# Patient Record
Sex: Male | Born: 1950
Health system: Southern US, Community
[De-identification: ages and names within clinical notes are randomized; demographics above are authoritative.]

## PROBLEM LIST (undated history)

## (undated) DIAGNOSIS — E785 Hyperlipidemia, unspecified: Secondary | ICD-10-CM

## (undated) DIAGNOSIS — N200 Calculus of kidney: Secondary | ICD-10-CM

## (undated) DIAGNOSIS — G4730 Sleep apnea, unspecified: Secondary | ICD-10-CM

## (undated) DIAGNOSIS — J302 Other seasonal allergic rhinitis: Secondary | ICD-10-CM

## (undated) DIAGNOSIS — Z87442 Personal history of urinary calculi: Secondary | ICD-10-CM

## (undated) DIAGNOSIS — K429 Umbilical hernia without obstruction or gangrene: Secondary | ICD-10-CM

## (undated) HISTORY — PX: HERNIA REPAIR: SHX51

## (undated) HISTORY — DX: Hyperlipidemia, unspecified: E78.5

## (undated) HISTORY — PX: COLONOSCOPY: SHX174

---

## 2006-10-28 ENCOUNTER — Encounter (INDEPENDENT_AMBULATORY_CARE_PROVIDER_SITE_OTHER): Payer: Self-pay | Admitting: *Deleted

## 2006-10-28 ENCOUNTER — Ambulatory Visit: Payer: Self-pay | Admitting: Internal Medicine

## 2006-10-28 ENCOUNTER — Ambulatory Visit (HOSPITAL_COMMUNITY): Admission: RE | Admit: 2006-10-28 | Discharge: 2006-10-28 | Payer: Self-pay | Admitting: Internal Medicine

## 2007-12-08 ENCOUNTER — Ambulatory Visit: Admission: RE | Admit: 2007-12-08 | Discharge: 2007-12-08 | Payer: Self-pay | Admitting: Internal Medicine

## 2007-12-08 ENCOUNTER — Encounter: Payer: Self-pay | Admitting: Pulmonary Disease

## 2008-01-01 ENCOUNTER — Ambulatory Visit: Payer: Self-pay | Admitting: Pulmonary Disease

## 2008-01-30 ENCOUNTER — Ambulatory Visit: Payer: Self-pay | Admitting: Pulmonary Disease

## 2008-02-11 DIAGNOSIS — G4733 Obstructive sleep apnea (adult) (pediatric): Secondary | ICD-10-CM

## 2008-08-25 ENCOUNTER — Emergency Department (HOSPITAL_COMMUNITY): Admission: EM | Admit: 2008-08-25 | Discharge: 2008-08-25 | Payer: Self-pay | Admitting: Emergency Medicine

## 2011-03-24 ENCOUNTER — Emergency Department (HOSPITAL_COMMUNITY): Payer: PRIVATE HEALTH INSURANCE

## 2011-03-24 ENCOUNTER — Emergency Department (HOSPITAL_COMMUNITY)
Admission: EM | Admit: 2011-03-24 | Discharge: 2011-03-24 | Disposition: A | Discharge status: Home / Self Care | Payer: PRIVATE HEALTH INSURANCE | Attending: Emergency Medicine | Admitting: Emergency Medicine

## 2011-03-24 DIAGNOSIS — N201 Calculus of ureter: Secondary | ICD-10-CM | POA: Insufficient documentation

## 2011-03-24 DIAGNOSIS — E7800 Pure hypercholesterolemia, unspecified: Secondary | ICD-10-CM | POA: Insufficient documentation

## 2011-03-24 DIAGNOSIS — E119 Type 2 diabetes mellitus without complications: Secondary | ICD-10-CM | POA: Insufficient documentation

## 2011-03-24 LAB — BASIC METABOLIC PANEL
BUN: 17 mg/dL (ref 6–23)
CO2: 27 mEq/L (ref 19–32)
Calcium: 9 mg/dL (ref 8.4–10.5)
Chloride: 101 mEq/L (ref 96–112)
Creatinine, Ser: 1.24 mg/dL (ref 0.4–1.5)
GFR calc Af Amer: >60 mL/min (ref >60)
Sodium: 138 mEq/L (ref 135–145)

## 2011-03-24 LAB — URINE MICROSCOPIC-ADD ON

## 2011-03-24 LAB — URINALYSIS, ROUTINE W REFLEX MICROSCOPIC
Ketones, ur: NEGATIVE mg/dL
Nitrite: NEGATIVE
Protein, ur: NEGATIVE mg/dL
Specific Gravity, Urine: 1.025 (ref 1.005–1.030)

## 2011-03-24 LAB — HEMOGLOBIN: Hemoglobin: 14.3 g/dL (ref 13.0–17.0)

## 2011-03-26 ENCOUNTER — Ambulatory Visit (HOSPITAL_COMMUNITY)
Admission: RE | Admit: 2011-03-26 | Discharge: 2011-03-26 | Disposition: A | Discharge status: Home / Self Care | Payer: PRIVATE HEALTH INSURANCE | Source: Ambulatory Visit | Attending: Urology | Admitting: Urology

## 2011-03-26 ENCOUNTER — Other Ambulatory Visit (HOSPITAL_COMMUNITY): Payer: Self-pay | Admitting: Urology

## 2011-03-26 ENCOUNTER — Encounter (HOSPITAL_COMMUNITY): Payer: Self-pay

## 2011-03-26 DIAGNOSIS — N201 Calculus of ureter: Secondary | ICD-10-CM

## 2011-03-26 DIAGNOSIS — N200 Calculus of kidney: Secondary | ICD-10-CM | POA: Insufficient documentation

## 2011-03-26 DIAGNOSIS — R109 Unspecified abdominal pain: Secondary | ICD-10-CM | POA: Insufficient documentation

## 2011-03-28 LAB — URINE CULTURE

## 2011-04-03 NOTE — Procedures (Signed)
NAMECHRISTIPHER, Micheal [name]                ACCOUNT NO.:  [id number]   MEDICAL RECORD NO.:  [id number]          PATIENT TYPE:  OUT   LOCATION:  SLEEP LAB                     FACILITY:  [facility]   PHYSICIAN:  Barbaraann Share, MD,FCCPDATE OF BIRTH:  12/12/50   DATE OF STUDY:  12/08/2007                            NOCTURNAL POLYSOMNOGRAM   REFERRING PHYSICIAN:  Kingsley Callander. Ouida Sills, MD   REFERRING PHYSICIAN:  Kingsley Callander. Ouida Sills, MD   INDICATIONS FOR PROCEDURE:  Hypersomnia with sleep apnea.   EPWORTH SCORE:  Five.   SLEEP ARCHITECTURE:  The patient had a total sleep time of 286 minutes  with very little slow wave sleep and REM.  Sleep onset latency was  normal at 16 minutes and REM onset was normal at 78 minutes.  Sleep  efficiency was 80% during the diagnostic portion, and 65% during the  titration portion.   RESPIRATORY DATA:  The patient underwent a split night protocol where he  was found to have 86 obstructive events in the first 137 minutes of  sleep.  This gave him an apnea/hypopnea index during the diagnostic  portion of 38 events per hour.  The events were not positional, but they  were clearly worse during REM.  There was very loud snoring noted  throughout.  By protocol the patient was placed on a CPAP mask and  initiated at 5-6 cm of water pressure.  Unfortunately, he could not  tolerate the CPAP pressure and ultimately asked that the CPAP be  discontinued.  The patient's study was continued as a diagnostic study  and during the last portion he was noted to have an apnea/hypopnea index  of 65 events per hour as he began to develop more prominent REM.   OXYGEN DATA:  There was O2 desaturation as low as 72% with the patient's  obstructive events.   CARDIAC DATA:  No clinically significant cardiac arrhythmias were noted.   MOVEMENT/PARASOMNIA:  None.   IMPRESSION/RECOMMENDATION:  Split night study reveals severe obstructive  sleep apnea with an apnea/hypopnea index during the first half of  the  night of 38 events per hour with O2 desaturation as low as 72%.  Very  loud snoring was noted throughout.  The patient was then placed on CPAP,  however, had very poor tolerance and asked that the CPAP be  discontinued.  The study was continued as a diagnostic study, and his  apnea/hypopnea index during the last half of the night was 65 events per  hour.  Treatment for this degree of sleep apnea should focus  primarily on weight loss as well as CPAP.  The patient will need  desensitization at the time of his CPAP trial at home, and will  ultimately need pressure optimization.      Barbaraann Share, MD,FCCP  Diplomate, American Board of Sleep  Medicine  Electronically Signed     [facility]/MEDQ  D:  01/01/2008 16:18:19  T:  01/03/2008 09:42:57  Job:  [id number]

## 2011-04-06 NOTE — Op Note (Signed)
NAME:  Micheal [name]                ACCOUNT NO.:  [id number]   MEDICAL RECORD NO.:  [id number]          PATIENT TYPE:  AMB   LOCATION:  DAY                           FACILITY:  [facility]   PHYSICIAN:  Lionel December, M.D.    DATE OF BIRTH:  03-30-1951   DATE OF PROCEDURE:  10/28/2006  DATE OF DISCHARGE:                               OPERATIVE REPORT   PROCEDURE:  High-risk ring colonoscopy.   INDICATION:  Micheal [name] is a 60 year old Caucasian male who is undergoing  high-risk screening colonoscopy.  His mother was diagnosed with colon  carcinoma at age 60 and died of metastatic disease at age 95.  His last  exam was 5 years ago and was normal.  The procedure and risks were  reviewed with the patient, informed consent was obtained.   MEDICATIONS FOR CONSCIOUS SEDATION:  Demerol 50 mg IV, Versed 4 mg IV.   FINDINGS:  Procedure performed in endoscopy suite.  The patient's vital  signs and O2 saturation were monitored during procedure and remained  stable.  The patient was placed in the left lateral position and a  rectal examination performed.  No abnormality noted on external or  digital exam.  Olympus video scope was placed in the rectum and advanced  under vision into sigmoid colon and beyond.  Preparation was  satisfactory.  Scope was passed into cecum, which was identified by  appendiceal orifice and ileocecal valve.  As the scope was withdrawn,  colonic mucosa was carefully examined.  There were as a 4 mm polyp on a  fold across from the ileocecal valve. which was ablated via cold biopsy.  There was another 3 x 5 mm polyp at sigmoid colon, which was ablated via  cold biopsy.  Mucosa of the rest of the colon was normal.  Rectal mucosa  similarly was normal.  Scope was retroflexed to examine the anorectal  junction.  There was some thickening to mucosa at the anal canal,  otherwise normal.  Endoscope was straightened and withdrawn.  The  patient tolerated the procedure well.   FINAL  DIAGNOSES:  1. Examination performed to cecum.  2. Two small polyps ablated via cold biopsy, one from cecum, another      one from sigmoid colon.   RECOMMENDATIONS:  Standard instructions given.  I will be contacting the  patient with the results of biopsy.  Given his family history and  today's findings, he will need to return for repeat exam in 5 years from  now.      Lionel December, M.D.  Electronically Signed    NR/MEDQ  D:  10/28/2006  T:  10/28/2006  Job:  [id number]   cc:   Kingsley Callander. Ouida Sills, MD  Fax: [phone]

## 2011-04-11 ENCOUNTER — Encounter (HOSPITAL_COMMUNITY): Payer: PRIVATE HEALTH INSURANCE

## 2011-04-12 ENCOUNTER — Ambulatory Visit: Admit: 2011-04-12 | Payer: PRIVATE HEALTH INSURANCE

## 2011-04-12 ENCOUNTER — Ambulatory Visit (HOSPITAL_COMMUNITY): Payer: PRIVATE HEALTH INSURANCE

## 2011-04-12 ENCOUNTER — Ambulatory Visit (HOSPITAL_COMMUNITY)
Admission: RE | Admit: 2011-04-12 | Discharge: 2011-04-12 | Disposition: A | Discharge status: Home / Self Care | Payer: PRIVATE HEALTH INSURANCE | Source: Ambulatory Visit | Attending: Urology | Admitting: Urology

## 2011-04-12 ENCOUNTER — Emergency Department (HOSPITAL_COMMUNITY)
Admission: EM | Admit: 2011-04-12 | Discharge: 2011-04-12 | Disposition: A | Discharge status: Home / Self Care | Payer: PRIVATE HEALTH INSURANCE | Attending: Emergency Medicine | Admitting: Emergency Medicine

## 2011-04-12 DIAGNOSIS — R109 Unspecified abdominal pain: Secondary | ICD-10-CM | POA: Insufficient documentation

## 2011-04-12 DIAGNOSIS — N201 Calculus of ureter: Secondary | ICD-10-CM

## 2011-04-12 DIAGNOSIS — E7800 Pure hypercholesterolemia, unspecified: Secondary | ICD-10-CM | POA: Insufficient documentation

## 2011-04-12 DIAGNOSIS — N200 Calculus of kidney: Secondary | ICD-10-CM | POA: Insufficient documentation

## 2011-04-12 DIAGNOSIS — E119 Type 2 diabetes mellitus without complications: Secondary | ICD-10-CM | POA: Insufficient documentation

## 2011-04-12 DIAGNOSIS — Z87442 Personal history of urinary calculi: Secondary | ICD-10-CM | POA: Insufficient documentation

## 2011-04-12 HISTORY — PX: CYSTOSCOPY W/ URETERAL STENT PLACEMENT: SHX1429

## 2011-04-12 LAB — DIFFERENTIAL
Basophils Absolute: 0 10*3/uL (ref 0.0–0.1)
Eosinophils Absolute: 0.2 10*3/uL (ref 0.0–0.7)
Lymphs Abs: 2.4 10*3/uL (ref 0.7–4.0)
Monocytes Relative: 9 % (ref 3–12)

## 2011-04-12 LAB — CBC
Hemoglobin: 12.5 g/dL — ABNORMAL LOW (ref 13.0–17.0)
MCH: 29.1 pg (ref 26.0–34.0)
MCHC: 33.4 g/dL (ref 30.0–36.0)
MCV: 87 fL (ref 78.0–100.0)
RBC: 4.3 MIL/uL (ref 4.22–5.81)
RDW: 12.9 % (ref 11.5–15.5)

## 2011-04-12 LAB — BASIC METABOLIC PANEL
BUN: 21 mg/dL (ref 6–23)
Creatinine, Ser: 1.34 mg/dL (ref 0.4–1.5)
GFR calc Af Amer: >60 mL/min (ref >60)
GFR calc non Af Amer: 54 mL/min — ABNORMAL LOW (ref >60)

## 2011-04-12 LAB — URINE MICROSCOPIC-ADD ON

## 2011-04-12 LAB — URINALYSIS, ROUTINE W REFLEX MICROSCOPIC
Ketones, ur: NEGATIVE mg/dL
Leukocytes, UA: NEGATIVE
Nitrite: NEGATIVE
Protein, ur: NEGATIVE mg/dL
Urobilinogen, UA: 0.2 mg/dL (ref 0.0–1.0)

## 2011-04-13 ENCOUNTER — Emergency Department (HOSPITAL_COMMUNITY)
Admission: EM | Admit: 2011-04-13 | Discharge: 2011-04-14 | Discharge status: Against Medical Advice | Payer: PRIVATE HEALTH INSURANCE | Attending: Emergency Medicine | Admitting: Emergency Medicine

## 2011-04-13 DIAGNOSIS — R509 Fever, unspecified: Secondary | ICD-10-CM | POA: Insufficient documentation

## 2011-04-19 NOTE — H&P (Signed)
NAME:  Micheal [name], Micheal [name]                ACCOUNT NO.:  [id number]  MEDICAL RECORD NO.:  [id number]           PATIENT TYPE:  O  LOCATION:  DAY                           FACILITY:  [facility]  PHYSICIAN:  Ky Barban, M.D.DATE OF BIRTH:  26-Sep-1951  DATE OF ADMISSION:  04/11/2011 DATE OF DISCHARGE:  LH                             HISTORY & PHYSICAL   A 60 years old gentleman who has right renal calculus.  He went to the emergency room with the right renal colic Mar 24, 2011.  CT scan showed there is a 7 x 6 mm stone in the right UPJ, no hydronephrosis.  I recommended that he undergo ESL, which we tried, but the patient's stone could not be focused very good.  We tried to break it.  It did not break.  On followup KUB, the stone is still there, which is unbroken, so what I have suggested that either we can retreat it or we do a ureteral stone basket with holmium laser lithotripsy, but I told him I am against retreating it because I would not be able to focus it I know that.  The second choice was doing a stone basket with holmium laser lithotripsy, but there is a real problem whether the stone is in the UPJ it might go back into the kidney then I may not be able to find it.  I may not be able to break it but anyway I told them that in that case I will have to get out without the procedure to being completed then plan for the next procedure, which will be percutaneous nephrolithotripsy.  I told him pro and cons of all this procedure.  He understands, want me to go ahead and schedule stone basket for, which he is coming as outpatient in the morning.  PAST MEDICAL HISTORY:  He has no history of any significant medical problem,  does have non-insulin dependent diabetes for which he is taking oral medicines.  PERSONAL HISTORY:  He does not smoke, drinks 2-3 bees in a week and otherwise unremarkable.  No history of prostate cancer in the family.  REVIEW OF SYSTEMS:  Unremarkable.  His AU score  is only 1.  PHYSICAL EXAMINATION:  VITAL SIGNS:  Blood pressure is 130/80, temperature is normal. CENTRAL NERVOUS SYSTEM:  No gross neurological deficit. HEAD/NECK/EYE AND ENT:  Negative. CHEST:  Symmetrical. HEART:  Regular sinus rhythm. ABDOMEN:  Obese.  Liver, spleen, kidneys not palpable.  No CVA tenderness. EXTERNAL GENITALIA:  Unremarkable. RECTAL:  Deferred. EXTREMITIES:  Normal.  IMPRESSION:  Right renal calculus.  PLAN:  Cystoscopy, right retrograde pyelogram, ureteroscopic stone basket extraction, holmium laser lithotripsy, insertion of double-J stent under anesthesia as outpatient, procedure limitation, complication especially ureteral perforation, stone migration.  Ureteral perforation leading to open surgery was discussed also stone migration was discussed in detail.  He understands, wants me to go ahead and proceed.  Also use of double-J stent was discussed.     Ky Barban, M.D.     MIJ/MEDQ  D:  04/11/2011  T:  04/12/2011  Job:  [id number]  Electronically Signed by  Alleen Borne M.D. on 04/19/2011 03:09:37 PM

## 2011-04-19 NOTE — Op Note (Signed)
NAME:  Micheal [name], Micheal [name]                ACCOUNT NO.:  [id number]  MEDICAL RECORD NO.:  [id number]           PATIENT TYPE:  O  LOCATION:  DAYP                          FACILITY:  [facility]  PHYSICIAN:  Ky Barban, M.D.DATE OF BIRTH:  02-24-1951  DATE OF PROCEDURE:  04/12/2011 DATE OF DISCHARGE:                              OPERATIVE REPORT   PREOPERATIVE DIAGNOSIS:  Right renal calculus.  POSTOPERATIVE DIAGNOSIS:  No renal calculus.  PROCEDURES:  Cystoscopy, right retrograde pyelogram, ureteroscopic stone basket and insertion of double-J stent size 5-French 24-cm.  ANESTHESIA:  General endotracheal.  PROCEDURE:  This gentleman had a 6-7 mm size stone in the right UPJ.  I had given ESL to that stone and about a week ago followup KUB which was done yesterday showed the stone is still intact and so I decided to do a stone basket.  During the night, he had a renal colic, came to the emergency room.  No studies or x-rays were done, but he felt like the stone has moved distally, but I told them I will have to do the x-rays again, so took into the OR under general endotracheal anesthesia in lithotomy position.  After usual prep and drape, #25 cystoscope introduced into the bladder.  Wedge catheter was introduced into the right ureteral orifice.  Hypaque was injected under fluoroscopic control.  The dye goes up into the upper ureter which was normal caliber right near the UPJ ureter was dilated and the upper pole calix was dilated and the renal pelvis did not fill up nicely.  Calyces were not visible.  The renal pelvis did not outline.  I thought that dilated upper pole calix was probably a renal his UPJ area, so a guidewire was passed up into that area and over the guidewire, I tried to pass ureteral access tube, but it will not go beyond ureterovesical junction, so it was just dilated with a balloon dilator #18 and then the ureteral access tube went in without any difficulty.  I  introduced the ureteral access tube and all the way near the UPJ and there was a hydronephrotic drip.  After I removed about 10 mL of urine from the renal pelvis, injected the dye.  It outlined the renal pelvis.  The middle and the upper calix appears to be dilated and the upper ureter was outlined which was slightly dilated down to the level of a filling defect and I thought that was where the stone was which has moved on little bit below the UPJ, so I looked in with the help of the flexible ureteroscope.  I did not see any stone and I looked up all the way in the renal pelvis and upper calix, middle calix and lower calix.  I did not see any stone and there was a lot of redness, edema and these calyces and some chronic inflammatory debris was floating around in that area and but I definitely did not see any stone.  I inspected the entire ureter by pulling the ureteral access tube all the way out and looking with the help of the ureteroscope, I did not  see.  Then, I decided to look into the distal ureter with the help of a rigid scope.  Rigid scope was introduced into the distal ureter.  Guidewire was passed up into the renal pelvis.  Over the guidewire, I advanced the rigid ureteroscope.  I went up into the mid ureter.  I did not see any stone in this area and so I decided to leave a double-J stent over the guidewire after removing the ureteroscope.  I introduced 40-French, 24-cm double-J stent under fluoroscopic control.  I had done another retrograde pyelogram by injecting the dye in through the ureteral access tube and it was in the upper ureter and the renal pelvis was outlined nicely.  I do not see any dilated calix.  They have decompressed and so a double-J stent was positioned with the help of fluoroscopy and nice loop in the renal pelvis and the bladder was obtained, so there was a two possibility, he passed the stone during the night, but it was rather large stone and but anyway  if the stone was still there, I cannot find it with the ureteroscope.  I will have to do a followup CT to see if I can see the stone.  The patient is doing well.  All the instruments were removed, left the operating room in satisfactory condition.     Ky Barban, M.D.     MIJ/MEDQ  D:  04/12/2011  T:  04/13/2011  Job:  [id number]  Electronically Signed by Alleen Borne M.D. on 04/19/2011 03:09:41 PM

## 2011-04-20 ENCOUNTER — Ambulatory Visit (HOSPITAL_COMMUNITY)
Admission: RE | Admit: 2011-04-20 | Discharge: 2011-04-20 | Disposition: A | Discharge status: Home / Self Care | Payer: PRIVATE HEALTH INSURANCE | Source: Ambulatory Visit | Attending: Urology | Admitting: Urology

## 2011-04-20 ENCOUNTER — Other Ambulatory Visit (HOSPITAL_COMMUNITY): Payer: Self-pay | Admitting: Urology

## 2011-04-20 DIAGNOSIS — R109 Unspecified abdominal pain: Secondary | ICD-10-CM

## 2011-04-20 DIAGNOSIS — S37019A Minor contusion of unspecified kidney, initial encounter: Secondary | ICD-10-CM

## 2011-04-20 DIAGNOSIS — N289 Disorder of kidney and ureter, unspecified: Secondary | ICD-10-CM | POA: Insufficient documentation

## 2011-04-20 DIAGNOSIS — N200 Calculus of kidney: Secondary | ICD-10-CM | POA: Insufficient documentation

## 2011-04-20 LAB — CREATININE, SERUM: GFR calc non Af Amer: >60 mL/min (ref >60)

## 2011-04-20 LAB — BUN: BUN: 18 mg/dL (ref 6–23)

## 2011-04-20 MED ORDER — IOHEXOL 300 MG/ML  SOLN
100.0000 mL | Freq: Once | INTRAMUSCULAR | Status: AC | PRN
  Administered 2011-04-20: 100 mL via INTRAVENOUS

## 2011-09-05 ENCOUNTER — Encounter (INDEPENDENT_AMBULATORY_CARE_PROVIDER_SITE_OTHER): Payer: Self-pay | Admitting: *Deleted

## 2011-10-01 ENCOUNTER — Telehealth (INDEPENDENT_AMBULATORY_CARE_PROVIDER_SITE_OTHER): Payer: Self-pay | Admitting: Internal Medicine

## 2011-10-01 NOTE — Telephone Encounter (Signed)
Would like to speak with someone about his tcs that is due. Please return the call to [phone].

## 2011-10-02 ENCOUNTER — Ambulatory Visit (INDEPENDENT_AMBULATORY_CARE_PROVIDER_SITE_OTHER): Payer: PRIVATE HEALTH INSURANCE | Admitting: General Surgery

## 2011-10-02 ENCOUNTER — Encounter (INDEPENDENT_AMBULATORY_CARE_PROVIDER_SITE_OTHER): Payer: Self-pay | Admitting: General Surgery

## 2011-10-02 VITALS — BP 136/94 | HR 68 | Temp 98.3°F | Resp 20 | Ht 75.0 in | Wt 206.6 lb

## 2011-10-02 DIAGNOSIS — K420 Umbilical hernia with obstruction, without gangrene: Secondary | ICD-10-CM

## 2011-10-02 NOTE — Patient Instructions (Signed)
[facility] ° °UMBILICAL OR INGUINAL HERNIA REPAIR: POST OP INSTRUCTIONS ° °Always review your discharge instruction sheet given to you by the facility where your surgery was performed. °IF YOU HAVE DISABILITY OR FAMILY LEAVE FORMS, YOU MUST BRING THEM TO THE OFFICE FOR PROCESSING.   °DO NOT GIVE THEM TO YOUR DOCTOR. ° °1. A  prescription for pain medication may be given to you upon discharge.  Take your pain medication as prescribed, if needed.  If narcotic pain medicine is not needed, then you may take acetaminophen (Tylenol), naprosyn (Alleve) or ibuprofen (Advil) as needed. °2. Take your usually prescribed medications unless otherwise directed. °3. If you need a refill on your pain medication, please contact your pharmacy.  They will contact our office to request authorization. Prescriptions will not be filled after 5 pm or on week-ends. °4. You should follow a light diet the first 24 hours after arrival home, such as soup and crackers, etc.  Be sure to include lots of fluids daily.  Resume your normal diet the day after surgery. °5. Most patients will experience some swelling and bruising around the umbilicus or in the groin and scrotum.  Ice packs and reclining will help.  Swelling and bruising can take several days to resolve.  °6. It is common to experience some constipation if taking pain medication after surgery.  Increasing fluid intake and taking a stool softener (such as Colace) will usually help or prevent this problem from occurring.  A mild laxative (Milk of Magnesia or Miralax) should be taken according to package directions if there are no bowel movements after 48 hours. °7. Unless discharge instructions indicate otherwise, you may remove your bandages 48 hours after surgery, and you may shower at that time.  You may have steri-strips (small skin tapes) in place directly over the incision.  These strips should be left on the skin for 7-10 days and will come off on their own.   If your surgeon used skin glue on the incision, you may shower in 24 hours.  The glue will flake off over the next 2-3 weeks.  Any sutures or staples will be removed at the office during your follow-up visit. °8. ACTIVITIES:  You may resume regular (light) daily activities beginning the next day--such as daily self-care, walking, climbing stairs--gradually increasing activities as tolerated.  You may have sexual intercourse when it is comfortable.  Refrain from any heavy lifting or straining until approved by your doctor. °a. You may drive when you are no longer taking prescription pain medication, you can comfortably wear a seatbelt, and you can safely maneuver your car and apply brakes. °b. RETURN TO WORK:  __________________________________________________________ °9. You should see your doctor in the office for a follow-up appointment approximately 2-3 weeks after your surgery.  Make sure that you call for this appointment within a day or two after you arrive home to insure a convenient appointment time. °10. OTHER INSTRUCTIONS:  __________________________________________________________________________________________________________________________________________________________________________________________  °WHEN TO CALL YOUR DOCTOR: °1. Fever over 101.0 °2. Inability to urinate °3. Nausea and/or vomiting °4. Extreme swelling or bruising °5. Continued bleeding from incision. °6. Increased pain, redness, or drainage from the incision ° °The clinic staff is available to answer your questions during regular business hours.  Please don’t hesitate to call and ask to speak to one of the nurses for clinical concerns.  If you have a medical emergency, go to the nearest emergency room or call 911.  A surgeon from [facility]   is always on call at the hospital ° ° °[address], [address], [place], [place]  [address] ? ° [address] [address], [place], [place]   [address] °[phone] ? [phone] ? FAX  [phone] °Web site: [email or web address] ° ° °

## 2011-10-02 NOTE — Progress Notes (Signed)
Chief Complaint  Patient presents with  . Other    new pt- eval umb hernia    HPI Micheal [name] is a 60 y.o. male.  Referred by Dr. Carylon Perches HPI This is a 60 year old male who presents with a bout 2-3 year history of an umbilical bulge. This area does not bother him in any way except it is present. It is not a little bit bigger over this time and is always out. He can manually reduce this. He has no change in his bowel movements or any nausea or vomiting associated with it. He does have a prior history of a colonoscopy 5 years ago which was negative per his report. He comes in today as he would like to have this repaired it is getting larger.  Past Medical History  Diagnosis Date  . Diabetes mellitus   . Hyperlipidemia   . Chronic kidney disease     hx of kidney stones    Past Surgical History  Procedure Date  . Lithotripsy     Family History  Problem Relation Age of Onset  . Cancer Mother     colon  . Stroke Father     Social History History  Substance Use Topics  . Smoking status: Never Smoker   . Smokeless tobacco: Not on file  . Alcohol Use: 0.0 oz/week    1-3 Cans of beer per week    No Known Allergies  Current Outpatient Prescriptions  Medication Sig Dispense Refill  . aspirin 81 MG tablet Take 81 mg by mouth daily.        . metFORMIN (GLUCOPHAGE-XR) 500 MG 24 hr tablet daily.      . simvastatin (ZOCOR) 20 MG tablet Take 20 mg by mouth at bedtime.          Review of Systems Review of Systems  Constitutional: Negative for fever, chills and unexpected weight change.  HENT: Positive for congestion. Negative for hearing loss, sore throat, trouble swallowing and voice change.   Eyes: Negative for visual disturbance.  Respiratory: Negative for cough and wheezing.   Cardiovascular: Negative for chest pain, palpitations and leg swelling.  Gastrointestinal: Negative for nausea, vomiting, abdominal pain, diarrhea, constipation, blood in stool, abdominal  distention, anal bleeding and rectal pain.  Genitourinary: Negative for hematuria and difficulty urinating.  Musculoskeletal: Negative for arthralgias.  Skin: Negative for rash and wound.  Neurological: Negative for seizures, syncope, weakness and headaches.  Hematological: Negative for adenopathy. Does not bruise/bleed easily.  Psychiatric/Behavioral: Negative for confusion.    Blood pressure 136/94, pulse 68, temperature 98.3 F (36.8 C), temperature source Temporal, resp. rate 20, height 6\' 3"  (1.905 m), weight 206 lb 9.6 oz (93.713 kg).  Physical Exam Physical Exam  Constitutional: He appears well-developed and well-nourished.  Eyes: No scleral icterus.  Neck: Neck supple.  Cardiovascular: Normal rate, regular rhythm and normal heart sounds.   Pulmonary/Chest: Effort normal and breath sounds normal. He has no wheezes. He has no rales.  Abdominal: Soft. Normal appearance and bowel sounds are normal. There is no tenderness. A hernia (nonreducible nontender umbilical hernia with thinned out skin overlying) is present.  Lymphadenopathy:    He has no cervical adenopathy.      Assessment    Irreducible umbilical hernia    Plan       We discussed observation versus repair.  We discussed  an open hernia repair with mesh. I described the procedure in detail.  The patient was given Agricultural engineer.  Goals  should be achieved with surgery. We discussed the usage of mesh and the rationale behind that. We went over the pathophysiology of umbilical hernia. We have elected to perform open umbilicalhernia repair with mesh.  We discussed the risks including bleeding, infection, recurrence, postoperative pain and seroma.      Srihari Shellhammer 10/02/2011, 11:06 AM

## 2011-10-03 NOTE — Telephone Encounter (Signed)
LMOM ASKING PATIENT TO CALL SO I CAN GET HIM SCHEDULED

## 2011-10-04 ENCOUNTER — Other Ambulatory Visit (INDEPENDENT_AMBULATORY_CARE_PROVIDER_SITE_OTHER): Payer: Self-pay | Admitting: *Deleted

## 2011-10-04 DIAGNOSIS — Z800 Family history of malignant neoplasm of digestive organs: Secondary | ICD-10-CM

## 2011-10-04 DIAGNOSIS — Z86010 Personal history of colonic polyps: Secondary | ICD-10-CM

## 2011-10-04 NOTE — Telephone Encounter (Signed)
TCS sch'd 11/22/11, patient aware

## 2011-10-08 ENCOUNTER — Encounter (INDEPENDENT_AMBULATORY_CARE_PROVIDER_SITE_OTHER): Payer: Self-pay | Admitting: *Deleted

## 2011-10-08 ENCOUNTER — Telehealth (INDEPENDENT_AMBULATORY_CARE_PROVIDER_SITE_OTHER): Payer: Self-pay | Admitting: *Deleted

## 2011-10-08 MED ORDER — PEG-KCL-NACL-NASULF-NA ASC-C 100 G PO SOLR: 1.0000 | Freq: Once | ORAL | Status: DC

## 2011-10-08 NOTE — Telephone Encounter (Signed)
Patient needs movi prep 

## 2011-10-10 ENCOUNTER — Other Ambulatory Visit (INDEPENDENT_AMBULATORY_CARE_PROVIDER_SITE_OTHER): Payer: Self-pay | Admitting: Internal Medicine

## 2011-10-16 ENCOUNTER — Encounter: Payer: Self-pay | Admitting: Gastroenterology

## 2011-10-16 ENCOUNTER — Encounter (HOSPITAL_COMMUNITY): Payer: Self-pay | Admitting: Pharmacy Technician

## 2011-10-16 ENCOUNTER — Ambulatory Visit (INDEPENDENT_AMBULATORY_CARE_PROVIDER_SITE_OTHER): Payer: PRIVATE HEALTH INSURANCE | Admitting: Gastroenterology

## 2011-10-16 VITALS — BP 147/91 | HR 79 | Temp 97.8°F | Ht 75.0 in | Wt 306.2 lb

## 2011-10-16 DIAGNOSIS — D126 Benign neoplasm of colon, unspecified: Secondary | ICD-10-CM

## 2011-10-16 DIAGNOSIS — Z800 Family history of malignant neoplasm of digestive organs: Secondary | ICD-10-CM | POA: Insufficient documentation

## 2011-10-16 NOTE — Progress Notes (Signed)
Primary Care Physician:  Carylon Perches, MD  Primary Gastroenterologist:  Roetta Sessions, MD  Chief Complaint  Patient presents with  . Colonoscopy    HPI:  Micheal [name] is a 60 y.o. male here to schedule surveillance colonoscopy. His last 2 colonoscopies were done by Dr. Karilyn Cota. The last one was in December 2007, he had multiple tubular adenomas. He was advised him back in 5 years. His mother had colon cancer diagnosed age 81 and subsequently died 5 years later. Patient came by our office yesterday to see about having a colonoscopy scheduled with Dr. Jena Gauss since Dr. Karilyn Cota is booked out until January.   Patient denies any constipation, diarrhea, melena, rectal bleeding, abdominal pain, vomiting, weight loss. Rarely has heartburn. No dysphagia. Appetite is good.  He has a large umbilical hernia and is scheduled to have it repaired on 10/26/2011 and would like to have his colonoscopy done before this.  Current Outpatient Prescriptions  Medication Sig Dispense Refill  . aspirin 81 MG tablet Take 81 mg by mouth daily.        . metFORMIN (GLUCOPHAGE-XR) 500 MG 24 hr tablet Take 1,000 mg by mouth daily with breakfast.       . simvastatin (ZOCOR) 20 MG tablet Take 20 mg by mouth at bedtime.          Allergies as of 10/16/2011  . (No Known Allergies)    Past Medical History  Diagnosis Date  . Diabetes mellitus   . Hyperlipidemia   . Chronic kidney disease     hx of kidney stones    Past Surgical History  Procedure Date  . Lithotripsy 2012  . Colonoscopy 10/28/2006    Dr. Lionel December, 2 small polyps, one from the cecum, one from sigmoid both tubular adenomas.    Family History  Problem Relation Age of Onset  . Cancer Mother     colon, age 53 deceased  . Stroke Father     History   Social History  . Marital Status: Divorced    Spouse Name: N/A    Number of Children: 2  . Years of Education: N/A   Occupational History  . farmer    Social History Main Topics  . Smoking  status: Never Smoker   . Smokeless tobacco: Not on file  . Alcohol Use: 0.0 oz/week    1-3 Cans of beer per week  . Drug Use: No  . Sexually Active:    Other Topics Concern  . Not on file   Social History Narrative  . No narrative on file      ROS:  General: Negative for anorexia, weight loss, fever, chills, fatigue, weakness. Eyes: Negative for vision changes.  ENT: Negative for hoarseness, difficulty swallowing , nasal congestion. CV: Negative for chest pain, angina, palpitations, dyspnea on exertion, peripheral edema.  Respiratory: Negative for dyspnea at rest, dyspnea on exertion, cough, sputum, wheezing.  GI: See history of present illness. GU:  Negative for dysuria, hematuria, urinary incontinence, urinary frequency, nocturnal urination.  MS: Negative for joint pain, low back pain.  Derm: Negative for rash or itching.  Neuro: Negative for weakness, abnormal sensation, seizure, frequent headaches, memory loss, confusion.  Psych: Negative for anxiety, depression, suicidal ideation, hallucinations.  Endo: Negative for unusual weight change.  Heme: Negative for bruising or bleeding. Allergy: Negative for rash or hives.    Physical Examination:  BP 147/91  Pulse 79  Temp(Src) 97.8 F (36.6 C) (Temporal)  Ht 6\' 3"  (1.905 m)  Wt  306 lb 3.2 oz (138.891 kg)  BMI 38.27 kg/m2   General: Well-nourished, well-developed in no acute distress.  Head: Normocephalic, atraumatic.   Eyes: Conjunctiva pink, no icterus. Mouth: Oropharyngeal mucosa moist and pink , no lesions erythema or exudate. Neck: Supple without thyromegaly, masses, or lymphadenopathy.  Lungs: Clear to auscultation bilaterally.  Heart: Regular rate and rhythm, no murmurs rubs or gallops.  Abdomen: Bowel sounds are normal, nontender, nondistended, no hepatosplenomegaly or masses, no abdominal bruits. Large umbilical hernia with overlying thin skin. no rebound or guarding.   Rectal: Deferred at time of  colonoscopy. Extremities: No lower extremity edema. No clubbing or deformities.  Neuro: Alert and oriented x 4 , grossly normal neurologically.  Skin: Warm and dry, no rash or jaundice.   Psych: Alert and cooperative, normal mood and affect.

## 2011-10-16 NOTE — Assessment & Plan Note (Signed)
Due for surveillance colonoscopy at this time. He is to have the procedure done prior to his umbilical hernia repair, therefore our office has agreed to assist him with getting this done. Otherwise he states he was scheduled out in January with Dr. Karilyn Cota. Patient also desires getting procedure done this calendar year since he has met his deductible.  I have discussed the risks, alternatives, benefits with regards to but not limited to the risk of reaction to medication, bleeding, infection, perforation and the patient is agreeable to proceed. Written consent to be obtained.

## 2011-10-16 NOTE — Patient Instructions (Signed)
We have scheduled you for a colonoscopy. Please see separate instructions. 

## 2011-10-16 NOTE — Progress Notes (Signed)
Cc to PCP 

## 2011-10-18 NOTE — Progress Notes (Signed)
1o gi DOC RMR-RMR OUT OF TOWN. PT DESIRES TO HAVE TCS PRIOR TO UMBILICAL HERNIA SURGERY DEC 7. CT JUN 2012 REVIEWED UMBILICAL HERNIA CONTAINS FAT.

## 2011-10-22 ENCOUNTER — Encounter (HOSPITAL_BASED_OUTPATIENT_CLINIC_OR_DEPARTMENT_OTHER): Payer: Self-pay | Admitting: *Deleted

## 2011-10-22 MED ORDER — SODIUM CHLORIDE 0.45 % IV SOLN
Freq: Once | INTRAVENOUS | Status: AC
  Administered 2011-10-23: 1000 mL via INTRAVENOUS

## 2011-10-22 NOTE — Pre-Procedure Instructions (Signed)
Had EKG 04/11/2011  No CPAP use, uses mouth guard at night; denies waking up gasping/choking, denies daytime sleepiness

## 2011-10-23 ENCOUNTER — Encounter (HOSPITAL_COMMUNITY): Payer: Self-pay | Admitting: *Deleted

## 2011-10-23 ENCOUNTER — Encounter (HOSPITAL_COMMUNITY)
Admission: RE | Disposition: A | Discharge status: Home / Self Care | Payer: Self-pay | Source: Ambulatory Visit | Attending: Gastroenterology

## 2011-10-23 ENCOUNTER — Ambulatory Visit (HOSPITAL_COMMUNITY)
Admission: RE | Admit: 2011-10-23 | Discharge: 2011-10-23 | Disposition: A | Discharge status: Home / Self Care | Payer: PRIVATE HEALTH INSURANCE | Source: Ambulatory Visit | Attending: Gastroenterology | Admitting: Gastroenterology

## 2011-10-23 ENCOUNTER — Other Ambulatory Visit: Payer: Self-pay | Admitting: Gastroenterology

## 2011-10-23 DIAGNOSIS — Z860100 Personal history of colon polyps, unspecified: Secondary | ICD-10-CM

## 2011-10-23 DIAGNOSIS — D126 Benign neoplasm of colon, unspecified: Secondary | ICD-10-CM

## 2011-10-23 DIAGNOSIS — Z800 Family history of malignant neoplasm of digestive organs: Secondary | ICD-10-CM | POA: Insufficient documentation

## 2011-10-23 DIAGNOSIS — Z7982 Long term (current) use of aspirin: Secondary | ICD-10-CM | POA: Insufficient documentation

## 2011-10-23 DIAGNOSIS — K648 Other hemorrhoids: Secondary | ICD-10-CM | POA: Insufficient documentation

## 2011-10-23 DIAGNOSIS — Z1211 Encounter for screening for malignant neoplasm of colon: Secondary | ICD-10-CM

## 2011-10-23 DIAGNOSIS — Z09 Encounter for follow-up examination after completed treatment for conditions other than malignant neoplasm: Secondary | ICD-10-CM | POA: Insufficient documentation

## 2011-10-23 DIAGNOSIS — E785 Hyperlipidemia, unspecified: Secondary | ICD-10-CM | POA: Insufficient documentation

## 2011-10-23 DIAGNOSIS — E119 Type 2 diabetes mellitus without complications: Secondary | ICD-10-CM | POA: Insufficient documentation

## 2011-10-23 HISTORY — PX: COLONOSCOPY: SHX5424

## 2011-10-23 SURGERY — COLONOSCOPY
Anesthesia: Moderate Sedation

## 2011-10-23 MED ORDER — MIDAZOLAM HCL 5 MG/5ML IJ SOLN
INTRAMUSCULAR | Status: DC | PRN
  Administered 2011-10-23: 2 mg via INTRAVENOUS
  Administered 2011-10-23: 1 mg via INTRAVENOUS

## 2011-10-23 MED ORDER — MEPERIDINE HCL 100 MG/ML IJ SOLN
INTRAMUSCULAR | Status: DC | PRN
  Administered 2011-10-23 (×2): 25 mg via INTRAVENOUS

## 2011-10-23 MED ORDER — MEPERIDINE HCL 100 MG/ML IJ SOLN
INTRAMUSCULAR | Status: AC
  Filled 2011-10-23: qty 2

## 2011-10-23 MED ORDER — MIDAZOLAM HCL 5 MG/5ML IJ SOLN
INTRAMUSCULAR | Status: AC
  Filled 2011-10-23: qty 10

## 2011-10-23 NOTE — Interval H&P Note (Signed)
History and Physical Interval Note:  10/23/2011 10:05 AM  Micheal [name]  has presented today for surgery, with the diagnosis of hx of colon polyps  The various methods of treatment have been discussed with the patient and family. After consideration of risks, benefits and other options for treatment, the patient has consented to  Procedure(s): COLONOSCOPY as a surgical intervention .  The patients' history has been reviewed, patient examined, no change in status, stable for surgery.  I have reviewed the patients' chart and labs.  Questions were answered to the patient's satisfaction.     [employer]

## 2011-10-23 NOTE — H&P (Signed)
Reason for Visit     Colonoscopy        Vitals - Last Recorded       BP Pulse Temp(Src) Ht Wt BMI    147/91  79  97.8 F (36.6 C) (Temporal)  6\' 3"  (1.905 m)  306 lb 3.2 oz (138.891 kg)  38.27 kg/m2       Vitals History Recorded       Progress Notes     Tana Coast, [place]  10/16/2011  1:07 PM  Signed Primary Care Physician:  Carylon Perches, MD   Primary Gastroenterologist:  Roetta Sessions, MD    Chief Complaint   Patient presents with   .  Colonoscopy      HPI:  Micheal [name] is a 60 y.o. male here to schedule surveillance colonoscopy. His last 2 colonoscopies were done by Dr. Karilyn Cota. The last one was in December 2007, he had multiple tubular adenomas. He was advised him back in 5 years. His mother had colon cancer diagnosed age 12 and subsequently died 5 years later. Patient came by our office yesterday to see about having a colonoscopy scheduled with Dr. Jena Gauss since Dr. Karilyn Cota is booked out until January.    Patient denies any constipation, diarrhea, melena, rectal bleeding, abdominal pain, vomiting, weight loss. Rarely has heartburn. No dysphagia. Appetite is good.   He has a large umbilical hernia and is scheduled to have it repaired on 10/26/2011 and would like to have his colonoscopy done before this.    Current Outpatient Prescriptions   Medication  Sig  Dispense  Refill   .  aspirin 81 MG tablet  Take 81 mg by mouth daily.           .  metFORMIN (GLUCOPHAGE-XR) 500 MG 24 hr tablet  Take 1,000 mg by mouth daily with breakfast.          .  simvastatin (ZOCOR) 20 MG tablet  Take 20 mg by mouth at bedtime.               Allergies as of 10/16/2011   .  (No Known Allergies)       Past Medical History   Diagnosis  Date   .  Diabetes mellitus     .  Hyperlipidemia     .  Chronic kidney disease         hx of kidney stones       Past Surgical History   Procedure  Date   .  Lithotripsy  2012   .  Colonoscopy  10/28/2006       Dr. Lionel December, 2 small polyps,  one from the cecum, one from sigmoid both tubular adenomas.       Family History   Problem  Relation  Age of Onset   .  Cancer  Mother         colon, age 5 deceased   .  Stroke  Father         History       Social History   .  Marital Status:  Divorced       Spouse Name:  N/A       Number of Children:  2   .  Years of Education:  N/A       Occupational History   .  farmer         Social History Main Topics   .  Smoking status:  Never Smoker    .  Smokeless tobacco:  Not on file   .  Alcohol Use:  0.0 oz/week       1-3 Cans of beer per week   .  Drug Use:  No   .  Sexually Active:         Other Topics  Concern   .  Not on file       Social History Narrative   .  No narrative on file        ROS:   General: Negative for anorexia, weight loss, fever, chills, fatigue, weakness. Eyes: Negative for vision changes.   ENT: Negative for hoarseness, difficulty swallowing , nasal congestion. CV: Negative for chest pain, angina, palpitations, dyspnea on exertion, peripheral edema.   Respiratory: Negative for dyspnea at rest, dyspnea on exertion, cough, sputum, wheezing.   GI: See history of present illness. GU:  Negative for dysuria, hematuria, urinary incontinence, urinary frequency, nocturnal urination.   MS: Negative for joint pain, low back pain.   Derm: Negative for rash or itching.   Neuro: Negative for weakness, abnormal sensation, seizure, frequent headaches, memory loss, confusion.   Psych: Negative for anxiety, depression, suicidal ideation, hallucinations.   Endo: Negative for unusual weight change.   Heme: Negative for bruising or bleeding. Allergy: Negative for rash or hives.     Physical Examination:   BP 147/91  Pulse 79  Temp(Src) 97.8 F (36.6 C) (Temporal)  Ht 6\' 3"  (1.905 m)  Wt 306 lb 3.2 oz (138.891 kg)  BMI 38.27 kg/m2    General: Well-nourished, well-developed in no acute distress.   Head: Normocephalic, atraumatic.    Eyes:  Conjunctiva pink, no icterus. Mouth: Oropharyngeal mucosa moist and pink , no lesions erythema or exudate. Neck: Supple without thyromegaly, masses, or lymphadenopathy.   Lungs: Clear to auscultation bilaterally.   Heart: Regular rate and rhythm, no murmurs rubs or gallops.   Abdomen: Bowel sounds are normal, nontender, nondistended, no hepatosplenomegaly or masses, no abdominal bruits. Large umbilical hernia with overlying thin skin. no rebound or guarding.    Rectal: Deferred at time of colonoscopy. Extremities: No lower extremity edema. No clubbing or deformities.   Neuro: Alert and oriented x 4 , grossly normal neurologically.   Skin: Warm and dry, no rash or jaundice.    Psych: Alert and cooperative, normal mood and affect.     Glendora Score  10/16/2011  2:54 PM  Signed Cc to PCP  Jonette Eva, MD  10/18/2011  1:40 PM  Signed 1o gi DOC RMR-RMR OUT OF TOWN. PT DESIRES TO HAVE TCS PRIOR TO UMBILICAL HERNIA SURGERY DEC 7. CT JUN 2012 REVIEWED UMBILICAL HERNIA CONTAINS FAT.     Colon adenomas - [place]ana [place], [place]  10/16/2011  1:06 PM  Signed Due for surveillance colonoscopy at this time. He is to have the procedure done prior to his umbilical hernia repair, therefore our office has agreed to assist him with getting this done. Otherwise he states he was scheduled out in January with Dr. Karilyn Cota. Patient also desires getting procedure done this calendar year since he has met his deductible.  I have discussed the risks, alternatives, benefits with regards to but not limited to the risk of reaction to medication, bleeding, infection, perforation and the patient is agreeable to proceed. Written consent to be obtained.

## 2011-10-24 ENCOUNTER — Encounter (HOSPITAL_BASED_OUTPATIENT_CLINIC_OR_DEPARTMENT_OTHER)
Admission: RE | Admit: 2011-10-24 | Discharge: 2011-10-24 | Disposition: A | Discharge status: Home / Self Care | Payer: PRIVATE HEALTH INSURANCE | Source: Ambulatory Visit | Attending: General Surgery | Admitting: General Surgery

## 2011-10-24 ENCOUNTER — Ambulatory Visit
Admission: RE | Admit: 2011-10-24 | Discharge: 2011-10-24 | Disposition: A | Discharge status: Home / Self Care | Payer: PRIVATE HEALTH INSURANCE | Source: Ambulatory Visit | Attending: General Surgery | Admitting: General Surgery

## 2011-10-24 LAB — BASIC METABOLIC PANEL
CO2: 27 mEq/L (ref 19–32)
Calcium: 9.4 mg/dL (ref 8.4–10.5)
Creatinine, Ser: 0.97 mg/dL (ref 0.50–1.35)
GFR calc Af Amer: >90 mL/min (ref >90)
GFR calc non Af Amer: 88 mL/min — ABNORMAL LOW (ref >90)
Sodium: 137 mEq/L (ref 135–145)

## 2011-10-24 LAB — CBC
Platelets: 214 10*3/uL (ref 150–400)
RBC: 4.96 MIL/uL (ref 4.22–5.81)
RDW: 13 % (ref 11.5–15.5)
WBC: 7.3 10*3/uL (ref 4.0–10.5)

## 2011-10-26 ENCOUNTER — Telehealth: Payer: Self-pay | Admitting: Gastroenterology

## 2011-10-26 ENCOUNTER — Ambulatory Visit (HOSPITAL_BASED_OUTPATIENT_CLINIC_OR_DEPARTMENT_OTHER): Payer: PRIVATE HEALTH INSURANCE | Admitting: Certified Registered Nurse Anesthetist

## 2011-10-26 ENCOUNTER — Encounter (HOSPITAL_BASED_OUTPATIENT_CLINIC_OR_DEPARTMENT_OTHER): Payer: Self-pay | Admitting: *Deleted

## 2011-10-26 ENCOUNTER — Encounter (HOSPITAL_BASED_OUTPATIENT_CLINIC_OR_DEPARTMENT_OTHER): Payer: Self-pay | Admitting: Certified Registered Nurse Anesthetist

## 2011-10-26 ENCOUNTER — Ambulatory Visit (HOSPITAL_BASED_OUTPATIENT_CLINIC_OR_DEPARTMENT_OTHER)
Admission: RE | Admit: 2011-10-26 | Discharge: 2011-10-26 | Disposition: A | Discharge status: Home / Self Care | Payer: PRIVATE HEALTH INSURANCE | Source: Ambulatory Visit | Attending: General Surgery | Admitting: General Surgery

## 2011-10-26 ENCOUNTER — Encounter (HOSPITAL_BASED_OUTPATIENT_CLINIC_OR_DEPARTMENT_OTHER)
Admission: RE | Disposition: A | Discharge status: Home / Self Care | Payer: Self-pay | Source: Ambulatory Visit | Attending: General Surgery

## 2011-10-26 DIAGNOSIS — E119 Type 2 diabetes mellitus without complications: Secondary | ICD-10-CM | POA: Insufficient documentation

## 2011-10-26 DIAGNOSIS — K420 Umbilical hernia with obstruction, without gangrene: Secondary | ICD-10-CM | POA: Insufficient documentation

## 2011-10-26 DIAGNOSIS — K219 Gastro-esophageal reflux disease without esophagitis: Secondary | ICD-10-CM | POA: Insufficient documentation

## 2011-10-26 DIAGNOSIS — G4733 Obstructive sleep apnea (adult) (pediatric): Secondary | ICD-10-CM | POA: Insufficient documentation

## 2011-10-26 DIAGNOSIS — Z01812 Encounter for preprocedural laboratory examination: Secondary | ICD-10-CM | POA: Insufficient documentation

## 2011-10-26 HISTORY — DX: Calculus of kidney: N20.0

## 2011-10-26 HISTORY — PX: UMBILICAL HERNIA REPAIR: SHX196

## 2011-10-26 HISTORY — DX: Other seasonal allergic rhinitis: J30.2

## 2011-10-26 HISTORY — DX: Umbilical hernia without obstruction or gangrene: K42.9

## 2011-10-26 HISTORY — DX: Sleep apnea, unspecified: G47.30

## 2011-10-26 LAB — GLUCOSE, CAPILLARY: Glucose-Capillary: 118 mg/dL — ABNORMAL HIGH (ref 70–99)

## 2011-10-26 LAB — POCT HEMOGLOBIN-HEMACUE: Hemoglobin: 15 g/dL (ref 13.0–17.0)

## 2011-10-26 SURGERY — REPAIR, HERNIA, UMBILICAL, ADULT
Anesthesia: General | Site: Abdomen | Wound class: Clean

## 2011-10-26 MED ORDER — LACTATED RINGERS IV SOLN
INTRAVENOUS | Status: DC
  Administered 2011-10-26 (×2): via INTRAVENOUS

## 2011-10-26 MED ORDER — MIDAZOLAM HCL 5 MG/5ML IJ SOLN
INTRAMUSCULAR | Status: DC | PRN
  Administered 2011-10-26: 1 mg via INTRAVENOUS

## 2011-10-26 MED ORDER — FENTANYL CITRATE 0.05 MG/ML IJ SOLN
INTRAMUSCULAR | Status: DC | PRN
  Administered 2011-10-26: 100 ug via INTRAVENOUS

## 2011-10-26 MED ORDER — GLYCOPYRROLATE 0.2 MG/ML IJ SOLN
INTRAMUSCULAR | Status: DC | PRN
  Administered 2011-10-26: .5 mg via INTRAVENOUS

## 2011-10-26 MED ORDER — LIDOCAINE HCL (CARDIAC) 20 MG/ML IV SOLN
INTRAVENOUS | Status: DC | PRN
  Administered 2011-10-26: 260 mg via INTRAVENOUS
  Administered 2011-10-26: 40 mg via INTRAVENOUS

## 2011-10-26 MED ORDER — ROCURONIUM BROMIDE 100 MG/10ML IV SOLN
INTRAVENOUS | Status: DC | PRN
  Administered 2011-10-26: 20 mg via INTRAVENOUS

## 2011-10-26 MED ORDER — NEOSTIGMINE METHYLSULFATE 1 MG/ML IJ SOLN
INTRAMUSCULAR | Status: DC | PRN
  Administered 2011-10-26: 3.5 mg via INTRAVENOUS

## 2011-10-26 MED ORDER — ONDANSETRON HCL 4 MG/2ML IJ SOLN
INTRAMUSCULAR | Status: DC | PRN
  Administered 2011-10-26: 4 mg via INTRAVENOUS

## 2011-10-26 MED ORDER — HYDROMORPHONE HCL PF 1 MG/ML IJ SOLN
0.2500 mg | INTRAMUSCULAR | Status: DC | PRN
  Administered 2011-10-26 (×2): 0.25 mg via INTRAVENOUS

## 2011-10-26 MED ORDER — OXYCODONE-ACETAMINOPHEN 5-325 MG PO TABS: 1.0000 | ORAL_TABLET | Freq: Four times a day (QID) | ORAL | Status: AC | PRN

## 2011-10-26 MED ORDER — CEFAZOLIN SODIUM-DEXTROSE 2-3 GM-% IV SOLR
2.0000 g | INTRAVENOUS | Status: AC
  Administered 2011-10-26: 2 g via INTRAVENOUS

## 2011-10-26 MED ORDER — BUPIVACAINE HCL (PF) 0.25 % IJ SOLN
INTRAMUSCULAR | Status: DC | PRN
  Administered 2011-10-26: 10 mL

## 2011-10-26 MED ORDER — SUCCINYLCHOLINE CHLORIDE 20 MG/ML IJ SOLN
INTRAMUSCULAR | Status: DC | PRN
  Administered 2011-10-26: 120 mg via INTRAVENOUS

## 2011-10-26 MED ORDER — PROMETHAZINE HCL 25 MG/ML IJ SOLN: 6.2500 mg | INTRAMUSCULAR | Status: DC | PRN

## 2011-10-26 MED ORDER — DROPERIDOL 2.5 MG/ML IJ SOLN
INTRAMUSCULAR | Status: DC | PRN
  Administered 2011-10-26: 0.625 mg via INTRAVENOUS

## 2011-10-26 SURGICAL SUPPLY — 47 items
ADH SKN CLS APL DERMABOND .7 (GAUZE/BANDAGES/DRESSINGS) ×1
APL SKNCLS STERI-STRIP NONHPOA (GAUZE/BANDAGES/DRESSINGS) ×1
BENZOIN TINCTURE PRP APPL 2/3 (GAUZE/BANDAGES/DRESSINGS) ×1 IMPLANT
BLADE SURG 15 STRL LF DISP TIS (BLADE) ×1 IMPLANT
BLADE SURG 15 STRL SS (BLADE) ×2
BLADE SURG ROTATE 9660 (MISCELLANEOUS) ×1 IMPLANT
CHLORAPREP W/TINT 26ML (MISCELLANEOUS) ×2 IMPLANT
CLOTH BEACON ORANGE TIMEOUT ST (SAFETY) ×2 IMPLANT
COVER MAYO STAND STRL (DRAPES) ×2 IMPLANT
COVER TABLE BACK 60X90 (DRAPES) ×2 IMPLANT
DECANTER SPIKE VIAL GLASS SM (MISCELLANEOUS) IMPLANT
DERMABOND ADVANCED (GAUZE/BANDAGES/DRESSINGS) ×1
DERMABOND ADVANCED .7 DNX12 (GAUZE/BANDAGES/DRESSINGS) IMPLANT
DRAPE PED LAPAROTOMY (DRAPES) ×2 IMPLANT
DRSG TEGADERM 4X4.75 (GAUZE/BANDAGES/DRESSINGS) ×2 IMPLANT
ELECT COATED BLADE 2.86 ST (ELECTRODE) ×2 IMPLANT
ELECT REM PT RETURN 9FT ADLT (ELECTROSURGICAL) ×2
ELECTRODE REM PT RTRN 9FT ADLT (ELECTROSURGICAL) ×1 IMPLANT
GLOVE BIO SURGEON STRL SZ7 (GLOVE) ×2 IMPLANT
GLOVE BIOGEL PI IND STRL 7.5 (GLOVE) ×1 IMPLANT
GLOVE BIOGEL PI INDICATOR 7.5 (GLOVE) ×1
GLOVE ECLIPSE 6.5 STRL STRAW (GLOVE) ×1 IMPLANT
GOWN PREVENTION PLUS XLARGE (GOWN DISPOSABLE) ×1 IMPLANT
NDL HYPO 25X1 1.5 SAFETY (NEEDLE) IMPLANT
NEEDLE HYPO 22GX1.5 SAFETY (NEEDLE) ×1 IMPLANT
NEEDLE HYPO 25X1 1.5 SAFETY (NEEDLE) ×2 IMPLANT
NS IRRIG 1000ML POUR BTL (IV SOLUTION) IMPLANT
PACK BASIN DAY SURGERY FS (CUSTOM PROCEDURE TRAY) ×2 IMPLANT
PATCH VENTRAL MEDIUM 6.4 (Mesh Specialty) ×1 IMPLANT
PENCIL BUTTON HOLSTER BLD 10FT (ELECTRODE) ×2 IMPLANT
SLEEVE SCD COMPRESS KNEE MED (MISCELLANEOUS) ×1 IMPLANT
SPONGE GAUZE 4X4 12PLY (GAUZE/BANDAGES/DRESSINGS) ×1 IMPLANT
SPONGE LAP 4X18 X RAY DECT (DISPOSABLE) ×3 IMPLANT
STRIP CLOSURE SKIN 1/2X4 (GAUZE/BANDAGES/DRESSINGS) ×1 IMPLANT
SUT ETHIBOND 0 MO6 C/R (SUTURE) ×2 IMPLANT
SUT MNCRL AB 4-0 PS2 18 (SUTURE) ×2 IMPLANT
SUT VIC AB 0 SH 27 (SUTURE) IMPLANT
SUT VIC AB 2-0 SH 27 (SUTURE) ×2
SUT VIC AB 2-0 SH 27XBRD (SUTURE) IMPLANT
SUT VIC AB 3-0 SH 27 (SUTURE) ×2
SUT VIC AB 3-0 SH 27X BRD (SUTURE) ×1 IMPLANT
SUT VICRYL 0 TIES 12 18 (SUTURE) ×1 IMPLANT
SUT VICRYL AB 3 0 TIES (SUTURE) IMPLANT
SYR CONTROL 10ML LL (SYRINGE) ×2 IMPLANT
TOWEL OR 17X24 6PK STRL BLUE (TOWEL DISPOSABLE) ×2 IMPLANT
TOWEL OR NON WOVEN STRL DISP B (DISPOSABLE) ×2 IMPLANT
WATER STERILE IRR 1000ML POUR (IV SOLUTION) ×1 IMPLANT

## 2011-10-26 NOTE — Anesthesia Postprocedure Evaluation (Signed)
  Anesthesia Post-op Note  Patient: Micheal [name]  Procedure(s) Performed:  HERNIA REPAIR UMBILICAL ADULT - umbilical hernia repair with mesh  Patient Location: PACU  Anesthesia Type: General  Level of Consciousness: awake and alert   Airway and Oxygen Therapy: Patient Spontanous Breathing  Post-op Pain: none  Post-op Assessment: Post-op Vital signs reviewed, Patient's Cardiovascular Status Stable, Respiratory Function Stable, Patent Airway, No signs of Nausea or vomiting and Pain level controlled  Post-op Vital Signs: Reviewed and stable  Complications: No apparent anesthesia complications

## 2011-10-26 NOTE — Telephone Encounter (Signed)
Results Cc to PCP  

## 2011-10-26 NOTE — Telephone Encounter (Signed)
Please call pt. She had BENIGN POLYP removed from her colon. TCS in 5 years. High fiber diet.

## 2011-10-26 NOTE — Telephone Encounter (Signed)
LMOM to call back

## 2011-10-26 NOTE — Anesthesia Preprocedure Evaluation (Addendum)
Anesthesia Evaluation  Patient identified by MRN, date of birth, ID band Patient awake    Reviewed: Allergy & Precautions, H&P , NPO status , Patient's Chart, lab work & pertinent test results  Airway Mallampati: III TM Distance: >3 FB Neck ROM: Full    Dental  (+) Dental Advisory Given and Teeth Intact   Pulmonary sleep apnea (severe OSA-does not tolerate CPAP) ,  clear to auscultation  Pulmonary exam normal       Cardiovascular neg cardio ROS Regular Normal    Neuro/Psych Negative Neurological ROS  Negative Psych ROS   GI/Hepatic Neg liver ROS, GERD-  Medicated and Controlled,  Endo/Other  Diabetes mellitus-, Type 2, Oral Hypoglycemic AgentsMorbid obesity  Renal/GU    History of kidney stones    Musculoskeletal  (+) Arthritis -,   Abdominal (+) obese,   Peds  Hematology   Anesthesia Other Findings   Reproductive/Obstetrics                       Anesthesia Physical Anesthesia Plan  ASA: III  Anesthesia Plan: General   Post-op Pain Management:    Induction: Intravenous  Airway Management Planned: Oral ETT  Additional Equipment:   Intra-op Plan:   Post-operative Plan: Extubation in OR  Informed Consent: I have reviewed the patients History and Physical, chart, labs and discussed the procedure including the risks, benefits and alternatives for the proposed anesthesia with the patient or authorized representative who has indicated his/her understanding and acceptance.   Dental advisory given  Plan Discussed with: CRNA and Surgeon  Anesthesia Plan Comments: (Plan routine monitors, GETA. Video Glide intubation.)       Anesthesia Quick Evaluation

## 2011-10-26 NOTE — Interval H&P Note (Signed)
History and Physical Interval Note:  10/26/2011 8:37 AM  Micheal [name]  has presented today for surgery, with the diagnosis of fix bellybutton bulge with mesh  The various methods of treatment have been discussed with the patient and family. After consideration of risks, benefits and other options for treatment, the patient has consented to  Procedure(s): Umbilical hernia repair with mesh as a surgical intervention .  The patients' history has been reviewed, patient examined, no change in status, stable for surgery.  I have reviewed the patients' chart and labs.  Questions were answered to the patient's satisfaction.     [name]

## 2011-10-26 NOTE — Transfer of Care (Signed)
Immediate Anesthesia Transfer of Care Note  Patient: Micheal [name]  Procedure(s) Performed:  HERNIA REPAIR UMBILICAL ADULT - umbilical hernia repair with mesh  Patient Location: PACU  Anesthesia Type: General  Level of Consciousness: awake, alert , oriented and patient cooperative  Airway & Oxygen Therapy: Patient Spontanous Breathing and Patient connected to face mask oxygen  Post-op Assessment: Report given to PACU RN and Post -op Vital signs reviewed and stable  Post vital signs: Reviewed and stable  Complications: No apparent anesthesia complications

## 2011-10-26 NOTE — H&P (View-Only) (Signed)
Chief Complaint  Patient presents with  . Other    new pt- eval umb hernia    HPI Micheal [name] is a 60 y.o. male.  Referred by Dr. Roy Fagan HPI This is a 60-year-old male who presents with a bout 2-3 year history of an umbilical bulge. This area does not bother him in any way except it is present. It is not a little bit bigger over this time and is always out. He can manually reduce this. He has no change in his bowel movements or any nausea or vomiting associated with it. He does have a prior history of a colonoscopy 5 years ago which was negative per his report. He comes in today as he would like to have this repaired it is getting larger.  Past Medical History  Diagnosis Date  . Diabetes mellitus   . Hyperlipidemia   . Chronic kidney disease     hx of kidney stones    Past Surgical History  Procedure Date  . Lithotripsy     Family History  Problem Relation Age of Onset  . Cancer Mother     colon  . Stroke Father     Social History History  Substance Use Topics  . Smoking status: Never Smoker   . Smokeless tobacco: Not on file  . Alcohol Use: 0.0 oz/week    1-3 Cans of beer per week    No Known Allergies  Current Outpatient Prescriptions  Medication Sig Dispense Refill  . aspirin 81 MG tablet Take 81 mg by mouth daily.        . metFORMIN (GLUCOPHAGE-XR) 500 MG 24 hr tablet daily.      . simvastatin (ZOCOR) 20 MG tablet Take 20 mg by mouth at bedtime.          Review of Systems Review of Systems  Constitutional: Negative for fever, chills and unexpected weight change.  HENT: Positive for congestion. Negative for hearing loss, sore throat, trouble swallowing and voice change.   Eyes: Negative for visual disturbance.  Respiratory: Negative for cough and wheezing.   Cardiovascular: Negative for chest pain, palpitations and leg swelling.  Gastrointestinal: Negative for nausea, vomiting, abdominal pain, diarrhea, constipation, blood in stool, abdominal  distention, anal bleeding and rectal pain.  Genitourinary: Negative for hematuria and difficulty urinating.  Musculoskeletal: Negative for arthralgias.  Skin: Negative for rash and wound.  Neurological: Negative for seizures, syncope, weakness and headaches.  Hematological: Negative for adenopathy. Does not bruise/bleed easily.  Psychiatric/Behavioral: Negative for confusion.    Blood pressure 136/94, pulse 68, temperature 98.3 F (36.8 C), temperature source Temporal, resp. rate 20, height 6' 3" (1.905 m), weight 206 lb 9.6 oz (93.713 kg).  Physical Exam Physical Exam  Constitutional: He appears well-developed and well-nourished.  Eyes: No scleral icterus.  Neck: Neck supple.  Cardiovascular: Normal rate, regular rhythm and normal heart sounds.   Pulmonary/Chest: Effort normal and breath sounds normal. He has no wheezes. He has no rales.  Abdominal: Soft. Normal appearance and bowel sounds are normal. There is no tenderness. A hernia (nonreducible nontender umbilical hernia with thinned out skin overlying) is present.  Lymphadenopathy:    He has no cervical adenopathy.      Assessment    Irreducible umbilical hernia    Plan       We discussed observation versus repair.  We discussed  an open hernia repair with mesh. I described the procedure in detail.  The patient was given educational material.  Goals   should be achieved with surgery. We discussed the usage of mesh and the rationale behind that. We went over the pathophysiology of umbilical hernia. We have elected to perform open umbilicalhernia repair with mesh.  We discussed the risks including bleeding, infection, recurrence, postoperative pain and seroma.      Micheal [name] 10/02/2011, 11:06 AM    

## 2011-10-26 NOTE — Op Note (Signed)
Preoperative diagnosis: Umbilical hernia Postoperative diagnosis: Incarcerated umbilical hernia  Procedure: Umbilical hernia repair with mesh, proceed ventral patch Excision of incarcerated omentum  Surgeon: Dr. Alvy Mo Anesthesia: Gen. Endotracheal anesthesia Estimated blood loss: None Specimens: None Drains: None Complications: None Sponge needle count correct x2 at end of operation  Indications: This is a 60 year old male with a history of morbid obesity who presents with an umbilical hernia that has been long-standing and increasing in size. I was unable to reduce this in the office. He and I discussed an open umbilical hernia repair likely with mesh. We discussed the risks and benefits associated with the procedure prior to beginning.  Procedure: After informed consent was obtained the patient was taken to the operating room. He had 2 g of intravenous cefazolin administered. He has sequential compression devices placed on his legs prior to beginning the operation. He was then placed under general endotracheal anesthesia without complication. His abdomen was then prepped and draped in the standard sterile surgical fashion. A surgical timeout was performed.  I made a periumbilical incision at the inferior aspect of his umbilicus. I carried this down to the subcutaneous fat. I then encircled his umbilical stalk which had a large amount of omentum incarcerated with a Kelly clamp. I then dissected as umbilical stalk to ensure there was no intestine involved. This was a large amount of omentum that was incarcerated. This hernia defect was about 1.5 cm and I was unable to reduce the omentum. I then excised the omentum that was incarcerated. I was unable to reduce the remainder of this. I also excised most of his hernia sac. I cleaned off the edges to make sure there was fascia to sew to. I then placed a 6.4 cm proceed ventral patch. This was laid flat underneath this fascia. This completely  obliterated the defect. I then sewed the ends to the fascia with 0 Ethibond suture I then tacked it to strip some with 0 Ethibond suture as well. I closed both ends with 0 Ethibond figure-of-eight sutures also. Hemostasis was then obtained. I irrigated copiously. I closed the subcutaneous tissue with 2-0 Vicryl. I tacked his umbilicus down with 3-0 Vicryl. I then closed the subcutaneous tissue with 3-0 Vicryl. I closed the skin with 4-0 Monocryl. The skin was very thinned out due to the chronicity of this hernia sac and it did have a small rent in the skin closed with Dermabond. I placed Dermabond assertions over the wound. Sterile dressing was placed. I infiltrated 10 cc of quarter percent Marcaine. I then he was then extubated and transferred to recovery in stable condition.

## 2011-10-26 NOTE — Anesthesia Procedure Notes (Signed)
Procedure Name: Intubation Date/Time: 10/26/2011 8:53 AM Performed by: Jadalee Westcott D Pre-anesthesia Checklist: Patient identified, Emergency Drugs available, Suction available and Patient being monitored Patient Re-evaluated:Patient Re-evaluated prior to inductionOxygen Delivery Method: Circle System Utilized Preoxygenation: Pre-oxygenation with 100% oxygen Intubation Type: IV induction Ventilation: Mask ventilation without difficulty Grade View: Grade I Tube type: Oral Number of attempts: 1 Airway Equipment and Method: video-laryngoscopy Placement Confirmation: ETT inserted through vocal cords under direct vision,  positive ETCO2 and breath sounds checked- equal and bilateral Secured at: 23 cm Tube secured with: Tape Dental Injury: Teeth and Oropharynx as per pre-operative assessment  Difficulty Due To: Difficulty was anticipated and Difficult Airway- due to limited oral opening

## 2011-10-29 NOTE — Telephone Encounter (Signed)
Informed pt of results.

## 2011-10-30 ENCOUNTER — Encounter (HOSPITAL_BASED_OUTPATIENT_CLINIC_OR_DEPARTMENT_OTHER): Payer: Self-pay | Admitting: General Surgery

## 2011-10-30 NOTE — Telephone Encounter (Signed)
Reminder in epic to have tcs in 5 years °

## 2011-11-01 ENCOUNTER — Encounter (HOSPITAL_COMMUNITY): Payer: Self-pay | Admitting: Gastroenterology

## 2011-11-05 ENCOUNTER — Ambulatory Visit (INDEPENDENT_AMBULATORY_CARE_PROVIDER_SITE_OTHER): Payer: PRIVATE HEALTH INSURANCE | Admitting: General Surgery

## 2011-11-05 ENCOUNTER — Encounter (INDEPENDENT_AMBULATORY_CARE_PROVIDER_SITE_OTHER): Payer: Self-pay | Admitting: General Surgery

## 2011-11-05 VITALS — BP 158/94 | HR 70 | Temp 97.2°F | Resp 18 | Ht 75.5 in | Wt 302.6 lb

## 2011-11-05 DIAGNOSIS — Z09 Encounter for follow-up examination after completed treatment for conditions other than malignant neoplasm: Secondary | ICD-10-CM

## 2011-11-05 NOTE — Progress Notes (Signed)
CC: yellow drainage from incision  HPI: 60 yo morbidly obese WM s/p open repair of incarcerated umbilical hernia with mesh 12/7 by Dr Dwain Sarna comes c/o some yellowish drainage from the actual umbilicus.  No f/c/n/v. Good appetite. Also had some skin irritation. Been applying regular bandaid each day.  BP 158/94  Pulse 70  Temp(Src) 97.2 F (36.2 C) (Temporal)  Resp 18  Ht 6' 3.5" (1.918 m)  Wt 302 lb 9.6 oz (137.258 kg)  BMI 37.32 kg/m2  Alert, nad, smiling cta Obese, soft, nt, nd. Transverse infraumbilical incision - intact, no cellulitis. Has some epidermalysis superior to incision. Has subcu hematoma/seroma. No fluctuance. Can't express any drainage.   Reviewed Dr Doreen Salvage op note  A/P:  S/p open repair of incarcerated umbilical hernia with mesh with postop subcutaneous hematoma vs seroma with some mild superficial skin loss.  No signs of active infection Discussed wound care - wash and pat area dry daily. Apple triple antibiotic ointment. Cover with gauze and minimize tape on skin. Avoid heavy lifting. Fu with Dr Dwain Sarna as scheduled.   Mary Sella. Andrey Campanile, MD, FACS General, Bariatric, & Minimally Invasive Surgery [facility], [place]

## 2011-11-05 NOTE — Patient Instructions (Signed)
May shower with dove soap. Pat area dry Apply triple antibiotic ointment like neosporin Cover with dry gauze.  Change daily.

## 2011-11-22 ENCOUNTER — Ambulatory Visit: Admit: 2011-11-22 | Payer: Self-pay | Admitting: Internal Medicine

## 2011-11-22 SURGERY — COLONOSCOPY
Anesthesia: Moderate Sedation

## 2011-11-23 ENCOUNTER — Encounter (INDEPENDENT_AMBULATORY_CARE_PROVIDER_SITE_OTHER): Payer: Self-pay | Admitting: General Surgery

## 2011-11-23 ENCOUNTER — Ambulatory Visit (INDEPENDENT_AMBULATORY_CARE_PROVIDER_SITE_OTHER): Payer: PRIVATE HEALTH INSURANCE | Admitting: General Surgery

## 2011-11-23 DIAGNOSIS — Z09 Encounter for follow-up examination after completed treatment for conditions other than malignant neoplasm: Secondary | ICD-10-CM

## 2011-11-23 NOTE — Progress Notes (Signed)
Subjective:     Patient ID: Micheal [name], male   DOB: 09-11-1951, 61 y.o.   MRN: [id number]  HPI This is a 61 year old male who had a symptomatic umbilical hernia. He is about a month out now from an umbilical hernia repair with mesh. He had a little bit of drainage from his wound and some superficial separation for which he was seen by one of my partners. This is now essentially all resolved and he is doing well without any significant complaints.  Review of Systems     Objective:   Physical Exam Healing incision with small superficial separation, no infection, no hernia    Assessment:     S/p UH repair    Plan:     I told him at one month he can begin increasing activity and at end of January can have no restrictions Return as needed or call with any problems

## 2014-10-11 ENCOUNTER — Other Ambulatory Visit: Payer: Self-pay | Admitting: Sports Medicine

## 2014-10-11 DIAGNOSIS — Z8739 Personal history of other diseases of the musculoskeletal system and connective tissue: Secondary | ICD-10-CM

## 2014-10-11 DIAGNOSIS — M545 Low back pain: Secondary | ICD-10-CM

## 2014-10-12 ENCOUNTER — Encounter (HOSPITAL_COMMUNITY)
Admission: EM | Disposition: A | Discharge status: Home / Self Care | Payer: Self-pay | Source: Home / Self Care | Attending: Emergency Medicine

## 2014-10-12 ENCOUNTER — Emergency Department (HOSPITAL_COMMUNITY)
Admission: EM | Admit: 2014-10-12 | Discharge: 2014-10-12 | Disposition: A | Discharge status: Home / Self Care | Payer: PRIVATE HEALTH INSURANCE | Attending: Emergency Medicine | Admitting: Emergency Medicine

## 2014-10-12 ENCOUNTER — Emergency Department (HOSPITAL_COMMUNITY): Payer: PRIVATE HEALTH INSURANCE

## 2014-10-12 ENCOUNTER — Ambulatory Visit: Admit: 2014-10-12 | Payer: Self-pay | Admitting: Internal Medicine

## 2014-10-12 ENCOUNTER — Encounter (HOSPITAL_COMMUNITY): Payer: Self-pay | Admitting: Emergency Medicine

## 2014-10-12 DIAGNOSIS — Z7982 Long term (current) use of aspirin: Secondary | ICD-10-CM | POA: Insufficient documentation

## 2014-10-12 DIAGNOSIS — E785 Hyperlipidemia, unspecified: Secondary | ICD-10-CM | POA: Diagnosis not present

## 2014-10-12 DIAGNOSIS — K222 Esophageal obstruction: Secondary | ICD-10-CM

## 2014-10-12 DIAGNOSIS — K228 Other specified diseases of esophagus: Secondary | ICD-10-CM | POA: Insufficient documentation

## 2014-10-12 DIAGNOSIS — E119 Type 2 diabetes mellitus without complications: Secondary | ICD-10-CM | POA: Insufficient documentation

## 2014-10-12 DIAGNOSIS — I6523 Occlusion and stenosis of bilateral carotid arteries: Secondary | ICD-10-CM | POA: Insufficient documentation

## 2014-10-12 DIAGNOSIS — K219 Gastro-esophageal reflux disease without esophagitis: Secondary | ICD-10-CM | POA: Insufficient documentation

## 2014-10-12 DIAGNOSIS — T18108A Unspecified foreign body in esophagus causing other injury, initial encounter: Secondary | ICD-10-CM

## 2014-10-12 DIAGNOSIS — T17208A Unspecified foreign body in pharynx causing other injury, initial encounter: Secondary | ICD-10-CM

## 2014-10-12 DIAGNOSIS — R1319 Other dysphagia: Secondary | ICD-10-CM | POA: Diagnosis not present

## 2014-10-12 DIAGNOSIS — G4730 Sleep apnea, unspecified: Secondary | ICD-10-CM | POA: Diagnosis not present

## 2014-10-12 HISTORY — PX: ESOPHAGOGASTRODUODENOSCOPY: SHX5428

## 2014-10-12 SURGERY — EGD (ESOPHAGOGASTRODUODENOSCOPY)
Anesthesia: Moderate Sedation

## 2014-10-12 SURGERY — CANCELLED PROCEDURE

## 2014-10-12 MED ORDER — MIDAZOLAM HCL 5 MG/5ML IJ SOLN
INTRAMUSCULAR | Status: DC | PRN
  Administered 2014-10-12: 2 mg via INTRAVENOUS

## 2014-10-12 MED ORDER — MIDAZOLAM HCL 5 MG/5ML IJ SOLN
INTRAMUSCULAR | Status: AC
  Filled 2014-10-12: qty 15

## 2014-10-12 MED ORDER — GLUCAGON HCL RDNA (DIAGNOSTIC) 1 MG IJ SOLR
1.0000 mg | Freq: Once | INTRAMUSCULAR | Status: AC
  Administered 2014-10-12: 1 mg via INTRAVENOUS
  Filled 2014-10-12: qty 1

## 2014-10-12 MED ORDER — MEPERIDINE HCL 100 MG/ML IJ SOLN
INTRAMUSCULAR | Status: DC | PRN
  Administered 2014-10-12: 25 mg via INTRAVENOUS

## 2014-10-12 MED ORDER — ONDANSETRON HCL 4 MG/2ML IJ SOLN
INTRAMUSCULAR | Status: DC | PRN
  Administered 2014-10-12: 4 mg via INTRAVENOUS

## 2014-10-12 MED ORDER — BUTAMBEN-TETRACAINE-BENZOCAINE 2-2-14 % EX AERO
INHALATION_SPRAY | CUTANEOUS | Status: DC | PRN
  Administered 2014-10-12: 1 via TOPICAL

## 2014-10-12 MED ORDER — MEPERIDINE HCL 100 MG/ML IJ SOLN
INTRAMUSCULAR | Status: AC
  Filled 2014-10-12: qty 2

## 2014-10-12 MED ORDER — ONDANSETRON HCL 4 MG/2ML IJ SOLN
INTRAMUSCULAR | Status: AC
  Filled 2014-10-12: qty 2

## 2014-10-12 NOTE — Procedures (Signed)
Endoprobe computer failure precluded producing the procedure note in that software  Emergency EGD with esophageal biopsy performed.  Indications:  probable food impaction.  Conscious sedation Versed 2 mg IV and Demerol 25 mg IV. Zofran 4 mg IV Cetacaine spray orally; instrument:Pentex video chip system  Findings  Longitudinally furrowed esophagus with excoriations and all little bit of mucus and food debris at the GE junction. No obstructing lesion seen.  passed the EG junction without difficulty; cursory exam cursory examination stomach revealed food sitting in the proximal stomach with quite a bit of fluid complete examination not attempted. Esophageal mucosa was friable diffusely. GERD. Probable noncritical stricture at the GE junction with a possible ring component. No tumor seen. No Barrett's esophagus seen. No attempted esophageal dilation made per plan. Food bolus passed. I did elect to biopsy the mid and distal esophagus to evaluate for EOE. This was done without difficulty or apparent complication  Patient tolerated procedure well   Impression: abnormal esophagus as described above query EOE status post biopsy; food impaction passed spontaneously with intubation of the esophagus.  Recommendations:  Swallowing precautions reviewed. Begin Protonix 40 mg daily. Follow up on pathology. Complete an elective EGD with possible esophageal dilation in 3-4 weeks.

## 2014-10-12 NOTE — ED Provider Notes (Signed)
CSN: [id number]     Arrival date & time 10/12/14  1850 History   First MD Initiated Contact with Patient 10/12/14 1852     Chief Complaint  Patient presents with  . Airway Obstruction     (Consider location/radiation/quality/duration/timing/severity/associated sxs/prior Treatment) HPI  This is a 63 year old male with a history of hyperlipidemia, diabetes, and sleep apnea who presents with possible food impaction. Patient reports that he was eating then injected and a proximally 2 PM this afternoon when he feels like something got stuck. He reports that since that time he has had multiple episodes of vomiting. He is unable to keep anything liquid down. He denies any abdominal pain.  He is able to tolerate his secretions.  This has never happened before.  Past Medical History  Diagnosis Date  . Hyperlipidemia   . Diabetes mellitus     NIDDM  . Umbilical hernia   . Kidney stones   . Seasonal allergies   . Sleep apnea sleep study 12/08/2007    no CPAP use; uses a mouth guard at night   Past Surgical History  Procedure Laterality Date  . Colonoscopy  10/28/2006; 10/23/2011  . Cystoscopy w/ ureteral stent placement  04/12/2011    right retrograde; basketing of stone  . Umbilical hernia repair  10/26/2011    Procedure: HERNIA REPAIR UMBILICAL ADULT;  Surgeon: Rolm Bookbinder, MD;  Location: [place];  Service: General;  Laterality: N/A;  umbilical hernia repair with mesh  . Colonoscopy  10/23/2011    Procedure: COLONOSCOPY;  Surgeon: Dorothyann Peng, MD;  [facility];  Service: Endoscopy;  Laterality: N/A;  12:45  . Hernia repair     Family History  Problem Relation Age of Onset  . Cancer Mother     colon, age 51 deceased  . Stroke Father    History  Substance Use Topics  . Smoking status: Never Smoker   . Smokeless tobacco: Never Used  . Alcohol Use: 0.0 oz/week    1-3 Cans of beer per week     Comment: occasionally    Review of Systems  HENT:  Positive for trouble swallowing. Negative for drooling.   Respiratory: Positive for choking. Negative for chest tightness and shortness of breath.   Cardiovascular: Negative for chest pain.  Gastrointestinal: Positive for nausea and vomiting. Negative for abdominal pain.  All other systems reviewed and are negative.     Allergies  Review of patient's allergies indicates no known allergies.  Home Medications   Prior to Admission medications   Medication Sig Start Date End Date Taking? Authorizing Provider  aspirin EC 81 MG tablet Take 81 mg by mouth daily.    Yes Historical Provider, MD  lisinopril (PRINIVIL,ZESTRIL) 20 MG tablet Take 20 mg by mouth daily. 09/27/14  Yes Historical Provider, MD  magnesium oxide (MAG-OX) 400 MG tablet Take 400 mg by mouth daily.     Yes Historical Provider, MD  metFORMIN (GLUCOPHAGE-XR) 500 MG 24 hr tablet Take 1,000 mg by mouth daily with breakfast.  09/23/11  Yes Historical Provider, MD  simvastatin (ZOCOR) 20 MG tablet Take 20 mg by mouth at bedtime.    Yes Historical Provider, MD  diazepam (VALIUM) 10 MG tablet 2 day supply starting on 10/12/2014 10/12/14   Historical Provider, MD   BP 144/80 mmHg  Pulse 93  Temp(Src) 97.5 F (36.4 C) (Oral)  Resp 18  Ht 6\' 6"  (1.981 m)  Wt 300 lb (136.079 kg)  BMI 34.68 kg/m2  SpO2 98% Physical Exam  Constitutional: He is oriented to person, place, and time. He appears well-developed and well-nourished.  Overweight, ABC's intact, tolerating his secretions  HENT:  Head: Normocephalic and atraumatic.  Eyes: Pupils are equal, round, and reactive to light.  Neck: Neck supple.  Cardiovascular: Normal rate, regular rhythm and normal heart sounds.   No murmur heard. Pulmonary/Chest: Effort normal and breath sounds normal. No respiratory distress. He has no wheezes.  Abdominal: Soft. Bowel sounds are normal. There is no tenderness. There is no rebound.  Musculoskeletal: He exhibits no edema.  Neurological: He is  alert and oriented to person, place, and time.  Skin: Skin is warm and dry.  Psychiatric: He has a normal mood and affect.  Nursing note and vitals reviewed.   ED Course  Procedures (including critical care time) Labs Review Labs Reviewed - No data to display  Imaging Review Dg Neck Soft Tissue  10/12/2014   CLINICAL DATA:  Full body in the throat since today  EXAM: NECK SOFT TISSUES - 1+ VIEW  COMPARISON:  None.  FINDINGS: There is no evidence of retropharyngeal soft tissue swelling or epiglottic enlargement. The cervical airway is unremarkable and no radio-opaque foreign body identified. There is bilateral carotid artery atherosclerosis.  Degenerative disc disease at C3-4 with disc space narrowing.  IMPRESSION: Unremarkable neck soft tissues.  No radiopaque foreign body.   Electronically Signed   By: Kathreen Devoid   On: 10/12/2014 20:13   Dg Chest 2 View  10/12/2014   CLINICAL DATA:  Foreign body in the throat since today.  EXAM: CHEST  2 VIEW  COMPARISON:  10/24/2011  FINDINGS: The heart size and mediastinal contours are within normal limits. Both lungs are clear. The visualized skeletal structures are unremarkable.  IMPRESSION: No active cardiopulmonary disease.   Electronically Signed   By: Kathreen Devoid   On: 10/12/2014 20:13     EKG Interpretation None      MDM   Final diagnoses:  Foreign body in throat    Patient presents with possible food impaction.  ABCs intact. Tolerating his secretions. However, patient reports he is unable to tolerate liquids. Plain films of the neck and chest are negative for radiopaque foreign body.  Patient given IV glucagon without any results. Discussed with Dr. Gala Romney.  He will come in and evaluate the patient in the endoscopy suite.    Merryl Hacker, MD 10/13/14 [id number]

## 2014-10-12 NOTE — H&P (Signed)
@LOGO @   Primary Care Physician:  Asencion Noble, MD Primary Gastroenterologist:  Dr. Gala Romney  Pre-Procedure History & Physical: HPI:  Micheal [name] is a 63 y.o. male here for evaluation of a  potential food impaction. Eating fried chicken about 2:00 this afternoon  -  swallowed a bolus and felt it lodge behind his breast bone. Has not been able to swallow, even his saliva, ever since that time. Came to the emergency department. Saw Dr. Dina Rich. No effect with glucagon. Dr. Dina Rich called me.   Patient denies significant reflux. Does have intermittent soft dysphagia. No prior EGD.  Past Medical History  Diagnosis Date  . Hyperlipidemia   . Diabetes mellitus     NIDDM  . Umbilical hernia   . Kidney stones   . Seasonal allergies   . Sleep apnea sleep study 12/08/2007    no CPAP use; uses a mouth guard at night    Past Surgical History  Procedure Laterality Date  . Colonoscopy  10/28/2006; 10/23/2011  . Cystoscopy w/ ureteral stent placement  04/12/2011    right retrograde; basketing of stone  . Umbilical hernia repair  10/26/2011    Procedure: HERNIA REPAIR UMBILICAL ADULT;  Surgeon: Rolm Bookbinder, MD;  Location: [place];  Service: General;  Laterality: N/A;  umbilical hernia repair with mesh  . Colonoscopy  10/23/2011    Procedure: COLONOSCOPY;  Surgeon: Dorothyann Peng, MD;  [facility];  Service: Endoscopy;  Laterality: N/A;  12:45  . Hernia repair      Prior to Admission medications   Medication Sig Start Date End Date Taking? Authorizing Provider  aspirin EC 81 MG tablet Take 81 mg by mouth daily.     Historical Provider, MD  diazepam (VALIUM) 10 MG tablet 2 day supply starting on 10/12/2014 10/12/14   Historical Provider, MD  lisinopril (PRINIVIL,ZESTRIL) 20 MG tablet Take 20 mg by mouth daily. 09/27/14   Historical Provider, MD  magnesium oxide (MAG-OX) 400 MG tablet Take 400 mg by mouth daily.      Historical Provider, MD  metFORMIN (GLUCOPHAGE-XR)  500 MG 24 hr tablet Take 1,000 mg by mouth daily with breakfast.  09/23/11   Historical Provider, MD  simvastatin (ZOCOR) 20 MG tablet Take 20 mg by mouth at bedtime.     Historical Provider, MD    Allergies as of 10/12/2014  . (No Known Allergies)    Family History  Problem Relation Age of Onset  . Cancer Mother     colon, age 64 deceased  . Stroke Father     History   Social History  . Marital Status: Married    Spouse Name: N/A    Number of Children: 2  . Years of Education: N/A   Occupational History  . farmer    Social History Main Topics  . Smoking status: Never Smoker   . Smokeless tobacco: Never Used  . Alcohol Use: 0.0 oz/week    1-3 Cans of beer per week     Comment: occasionally  . Drug Use: No  . Sexual Activity: Not on file   Other Topics Concern  . Not on file   Social History Narrative    Review of Systems: See HPI, otherwise negative ROS  Physical Exam: BP 138/93 mmHg  Pulse 102  Temp(Src) 97.6 F (36.4 C)  Resp 18  Ht 6\' 6"  (1.981 m)  Wt 300 lb (136.079 kg)  BMI 34.68 kg/m2  SpO2 97% General:   Alert,  Well-developed, well-nourished, pleasant and cooperative in NAD Skin:  Intact without significant lesions or rashes. Eyes:  Sclera clear, no icterus.   Conjunctiva pink. Ears:  Normal auditory acuity. Nose:  No deformity, discharge,  or lesions. Mouth:  No deformity or lesions. Neck:  Supple; no masses or thyromegaly. No significant cervical adenopathy. Lungs:  Clear throughout to auscultation.   No wheezes, crackles, or rhonchi. No acute distress. Heart:  Regular rate and rhythm; no murmurs, clicks, rubs,  or gallops. Abdomen: Non-distended, normal bowel sounds.  Soft and nontender without appreciable mass or hepatosplenomegaly.  Pulses:  Normal pulses noted. Extremities:  Without clubbing or edema.  Impression/Recommendations:  63 year old gentleman presented to the ED this evening with probable food impaction by history. Chronic  intermittent esophageal dysphagia. Offered emergent EGD with removal of food impaction. If upper GI tract not empty, will need to defer esophageal dilation is appropriate into an elective setting.The risks, benefits, limitations, alternatives and imponderables have been reviewed with the patient. Potential for esophageal dilation, biopsy, etc. have also been reviewed.  Questions have been answered. All parties agreeable.         Notice: This dictation was prepared with Dragon dictation along with smaller phrase technology. Any transcriptional errors that result from this process are unintentional and may not be corrected upon review.

## 2014-10-12 NOTE — ED Notes (Signed)
Per Endo, send pt over with family they will explain procedure and have pt sign consent.

## 2014-10-12 NOTE — Discharge Instructions (Addendum)
EGD Discharge instructions Please read the instructions outlined below and refer to this sheet in the next few weeks. These discharge instructions provide you with general information on caring for yourself after you leave the hospital. Your doctor may also give you specific instructions. While your treatment has been planned according to the most current medical practices available, unavoidable complications occasionally occur. If you have any problems or questions after discharge, please call your doctor. ACTIVITY  You may resume your regular activity but move at a slower pace for the next 24 hours.   Take frequent rest periods for the next 24 hours.   Walking will help expel (get rid of) the air and reduce the bloated feeling in your abdomen.   No driving for 24 hours (because of the anesthesia (medicine) used during the test).   You may shower.   Do not sign any important legal documents or operate any machinery for 24 hours (because of the anesthesia used during the test).  NUTRITION  Drink plenty of fluids.   You may resume your normal diet.   Begin with a light meal and progress to your normal diet.   Avoid alcoholic beverages for 24 hours or as instructed by your caregiver.  MEDICATIONS  You may resume your normal medications unless your caregiver tells you otherwise.  WHAT YOU CAN EXPECT TODAY  You may experience abdominal discomfort such as a feeling of fullness or gas pains.  FOLLOW-UP  Your doctor will discuss the results of your test with you.  SEEK IMMEDIATE MEDICAL ATTENTION IF ANY OF THE FOLLOWING OCCUR:  Excessive nausea (feeling sick to your stomach) and/or vomiting.   Severe abdominal pain and distention (swelling).   Trouble swallowing.   Temperature over 101 F (37.8 C).   Rectal bleeding or vomiting of blood.    GERD information provided  Swallowing precautions reviewed  Begin Protonix 40 mg daily  We'll arrange repeat EGD with possible  esophageal dilation in about 3-4 weeks   Gastroesophageal Reflux Disease, Adult Gastroesophageal reflux disease (GERD) happens when acid from your stomach flows up into the esophagus. When acid comes in contact with the esophagus, the acid causes soreness (inflammation) in the esophagus. Over time, GERD may create small holes (ulcers) in the lining of the esophagus. CAUSES   Increased body weight. This puts pressure on the stomach, making acid rise from the stomach into the esophagus.  Smoking. This increases acid production in the stomach.  Drinking alcohol. This causes decreased pressure in the lower esophageal sphincter (valve or ring of muscle between the esophagus and stomach), allowing acid from the stomach into the esophagus.  Late evening meals and a full stomach. This increases pressure and acid production in the stomach.  A malformed lower esophageal sphincter. Sometimes, no cause is found. SYMPTOMS   Burning pain in the lower part of the mid-chest behind the breastbone and in the mid-stomach area. This may occur twice a week or more often.  Trouble swallowing.  Sore throat.  Dry cough.  Asthma-like symptoms including chest tightness, shortness of breath, or wheezing. DIAGNOSIS  Your caregiver may be able to diagnose GERD based on your symptoms. In some cases, X-rays and other tests may be done to check for complications or to check the condition of your stomach and esophagus. TREATMENT  Your caregiver may recommend over-the-counter or prescription medicines to help decrease acid production. Ask your caregiver before starting or adding any new medicines.  HOME CARE INSTRUCTIONS   Change the factors  that you can control. Ask your caregiver for guidance concerning weight loss, quitting smoking, and alcohol consumption.  Avoid foods and drinks that make your symptoms worse, such as:  Caffeine or alcoholic drinks.  Chocolate.  Peppermint or mint flavorings.  Garlic  and onions.  Spicy foods.  Citrus fruits, such as oranges, lemons, or limes.  Tomato-based foods such as sauce, chili, salsa, and pizza.  Fried and fatty foods.  Avoid lying down for the 3 hours prior to your bedtime or prior to taking a nap.  Eat small, frequent meals instead of large meals.  Wear loose-fitting clothing. Do not wear anything tight around your waist that causes pressure on your stomach.  Raise the head of your bed 6 to 8 inches with wood blocks to help you sleep. Extra pillows will not help.  Only take over-the-counter or prescription medicines for pain, discomfort, or fever as directed by your caregiver.  Do not take aspirin, ibuprofen, or other nonsteroidal anti-inflammatory drugs (NSAIDs). SEEK IMMEDIATE MEDICAL CARE IF:   You have pain in your arms, neck, jaw, teeth, or back.  Your pain increases or changes in intensity or duration.  You develop nausea, vomiting, or sweating (diaphoresis).  You develop shortness of breath, or you faint.  Your vomit is green, yellow, black, or looks like coffee grounds or blood.  Your stool is red, bloody, or black. These symptoms could be signs of other problems, such as heart disease, gastric bleeding, or esophageal bleeding. MAKE SURE YOU:   Understand these instructions.  Will watch your condition.  Will get help right away if you are not doing well or get worse. Document Released: 08/15/2005 Document Revised: 01/28/2012 Document Reviewed: 05/25/2011 [facility] Patient Information 2015 [place], [place]. This information is not intended to replace advice given to you by your health care provider. Make sure you discuss any questions you have with your health care provider.

## 2014-10-12 NOTE — ED Notes (Signed)
Pt c/o feeling like there is something stuck in his throat. Pt state he was eating chicken about 1400. Pt states he cannot keep liquids down.

## 2014-10-13 ENCOUNTER — Telehealth: Payer: Self-pay | Admitting: Gastroenterology

## 2014-10-13 NOTE — Telephone Encounter (Signed)
Patient seen after hours for emergency food disimpaction. He needs an OV with [place] in next 2-3 weeks to discuss elective EGD/ED

## 2014-10-13 NOTE — Telephone Encounter (Signed)
APPT MADE IN URGENT SLOT, [place] PER ANNA. PATIENT AWARE OF DATE AND TIME

## 2014-10-16 ENCOUNTER — Encounter: Payer: Self-pay | Admitting: Internal Medicine

## 2014-10-19 ENCOUNTER — Telehealth: Payer: Self-pay

## 2014-10-19 NOTE — Telephone Encounter (Signed)
Send letter to patient.  Send copy of letter with path to referring provider and PCP.  Needs ov w extender in 1 month to set up repeat egd w pos dilation.  Send him info on GERD and EOE

## 2014-10-19 NOTE — Telephone Encounter (Signed)
Faxed to pcp °

## 2014-10-19 NOTE — Telephone Encounter (Signed)
Letter and info sheets mailed to the pt.

## 2014-10-19 NOTE — Telephone Encounter (Signed)
SPOKE TO PATIENT HE IS AWARE OF APPOINTMENT

## 2014-10-21 ENCOUNTER — Encounter (HOSPITAL_COMMUNITY): Payer: Self-pay | Admitting: Internal Medicine

## 2014-10-26 ENCOUNTER — Other Ambulatory Visit: Payer: Self-pay | Admitting: Sports Medicine

## 2014-10-26 DIAGNOSIS — Z139 Encounter for screening, unspecified: Secondary | ICD-10-CM

## 2014-10-27 ENCOUNTER — Ambulatory Visit
Admission: RE | Admit: 2014-10-27 | Discharge: 2014-10-27 | Disposition: A | Discharge status: Home / Self Care | Payer: PRIVATE HEALTH INSURANCE | Source: Ambulatory Visit | Attending: Sports Medicine | Admitting: Sports Medicine

## 2014-10-27 ENCOUNTER — Other Ambulatory Visit: Payer: PRIVATE HEALTH INSURANCE

## 2014-10-27 DIAGNOSIS — M545 Low back pain: Secondary | ICD-10-CM

## 2014-10-27 DIAGNOSIS — Z8739 Personal history of other diseases of the musculoskeletal system and connective tissue: Secondary | ICD-10-CM

## 2014-10-27 DIAGNOSIS — Z139 Encounter for screening, unspecified: Secondary | ICD-10-CM

## 2014-11-04 ENCOUNTER — Other Ambulatory Visit: Payer: Self-pay

## 2014-11-04 ENCOUNTER — Encounter: Payer: Self-pay | Admitting: Gastroenterology

## 2014-11-04 ENCOUNTER — Ambulatory Visit (INDEPENDENT_AMBULATORY_CARE_PROVIDER_SITE_OTHER): Payer: PRIVATE HEALTH INSURANCE | Admitting: Gastroenterology

## 2014-11-04 VITALS — BP 134/86 | HR 83 | Temp 97.9°F | Ht 74.0 in | Wt 312.8 lb

## 2014-11-04 DIAGNOSIS — K200 Eosinophilic esophagitis: Secondary | ICD-10-CM | POA: Insufficient documentation

## 2014-11-04 DIAGNOSIS — K222 Esophageal obstruction: Secondary | ICD-10-CM | POA: Insufficient documentation

## 2014-11-04 NOTE — Patient Instructions (Signed)
Upper endoscopy as scheduled. See separate instructions.  We will attempt to get allergy testing records but you may still require referral for additional testing for food allergies.

## 2014-11-04 NOTE — Progress Notes (Signed)
Primary Care Physician:  FAGAN,ROY, MD  Primary Gastroenterologist:  Michael Rourk, MD   Chief Complaint  Patient presents with  . EGD    HPI:  Micheal [name] is a 63 y.o. male here to schedule a repeat EGD for esophageal dilation. Recently seen emergently on 10/12/2014 with question will food impaction. Patient states he felt the food dislodge a few minutes before the procedure, actually vomited the bolus up. At time of his EGD he had longitudinally furrowed esophagus with excoriations with a little bit of mucus and food debris at the GE junction. Esophageal mucosa was friable diffusely. Felt to have a probable noncritical stricture at the GE junction with possible ring component. Mid and distal esophagus was biopsied to evaluate for EOE, biopsies consistent with eosinophilic esophagitis.  Patient states he previously went to an allergist and is "allergic to everything". He is not sure whether he had food testing however. Going to try to get us the name of the physician so we can request records. Overall he feels well. Really hasn't had any issue swallowing. Denies heartburn, abdominal pain, constipation, diarrhea, melena, rectal bleeding, vomiting.   Patient states that he would like to consider going back to Dr. Rehman since he used to be his primary GI. He ended up seeing us again in 2012 when he was in need of having colonoscopy done prior to hernia surgery and we were able to facilitate this for him. Dr. Fields actually did the procedure since Dr. Rourk was out of town at the time. Dr. Rourk is listed as primary gastroenterologist for our practice. I did mention this to Camille, our office manager, today and she stated that patient should make decision as far as who he will continue his GI care with.    Current Outpatient Prescriptions  Medication Sig Dispense Refill  . aspirin EC 81 MG tablet Take 81 mg by mouth daily.     . lisinopril (PRINIVIL,ZESTRIL) 20 MG tablet Take 20 mg by mouth  daily.    . metFORMIN (GLUCOPHAGE-XR) 500 MG 24 hr tablet Take 1,000 mg by mouth daily with breakfast.     . pantoprazole (PROTONIX) 40 MG tablet Take 40 mg by mouth daily.      No current facility-administered medications for this visit.    Allergies as of 11/04/2014  . (No Known Allergies)    Past Medical History  Diagnosis Date  . Hyperlipidemia   . Diabetes mellitus     NIDDM  . Umbilical hernia   . Kidney stones   . Seasonal allergies   . Sleep apnea sleep study 12/08/2007    no CPAP use; uses a mouth guard at night    Past Surgical History  Procedure Laterality Date  . Colonoscopy  10/28/2006; 10/23/2011  . Cystoscopy w/ ureteral stent placement  04/12/2011    right retrograde; basketing of stone  . Umbilical hernia repair  10/26/2011    Procedure: HERNIA REPAIR UMBILICAL ADULT;  Surgeon: Matthew Wakefield, MD;  Location: [facility];  Service: General;  Laterality: N/A;  umbilical hernia repair with mesh  . Colonoscopy  10/23/2011    SLF:  1). Internal hemorroids, small  2). POLYPOID LESION (AC) . next TCS 10/2016  . Hernia repair    . Esophagogastroduodenoscopy N/A 10/12/2014    RMR:  abnormal esophagus as described above query EOE status post biopsy; food impaction passes spontaneously with intubation of the esophagus c/w EOE, probable noncritical stricture at GEJ with possible ring component. food bolus   had passed.     Family History  Problem Relation Age of Onset  . Cancer Mother     colon, age 70 deceased  . Stroke Father     History   Social History  . Marital Status: Married    Spouse Name: N/A    Number of Children: 2  . Years of Education: N/A   Occupational History  . farmer    Social History Main Topics  . Smoking status: Never Smoker   . Smokeless tobacco: Never Used  . Alcohol Use: 0.0 oz/week    1-3 Cans of beer per week     Comment: occasionally  . Drug Use: No  . Sexual Activity: Not on file   Other Topics Concern  . Not  on file   Social History Narrative      ROS:  General: Negative for anorexia, weight loss, fever, chills, fatigue, weakness. Eyes: Negative for vision changes.  ENT: Negative for hoarseness, difficulty swallowing , nasal congestion. CV: Negative for chest pain, angina, palpitations, dyspnea on exertion, peripheral edema.  Respiratory: Negative for dyspnea at rest, dyspnea on exertion, cough, sputum, wheezing.  GI: See history of present illness. GU:  Negative for dysuria, hematuria, urinary incontinence, urinary frequency, nocturnal urination.  MS: Negative for joint pain, low back pain.  Derm: Negative for rash or itching.  Neuro: Negative for weakness, abnormal sensation, seizure, frequent headaches, memory loss, confusion.  Psych: Negative for anxiety, depression, suicidal ideation, hallucinations.  Endo: Negative for unusual weight change.  Heme: Negative for bruising or bleeding. Allergy: Negative for rash or hives.    Physical Examination:  BP 134/86 mmHg  Pulse 83  Temp(Src) 97.9 F (36.6 C) (Oral)  Ht 6' 2" (1.88 m)  Wt 312 lb 12.8 oz (141.885 kg)  BMI 40.14 kg/m2   General: Well-nourished, well-developed in no acute distress.  Head: Normocephalic, atraumatic.   Eyes: Conjunctiva pink, no icterus. Mouth: Oropharyngeal mucosa moist and pink , no lesions erythema or exudate. Neck: Supple without thyromegaly, masses, or lymphadenopathy.  Lungs: Clear to auscultation bilaterally.  Heart: Regular rate and rhythm, no murmurs rubs or gallops.  Abdomen: Bowel sounds are normal, nontender, nondistended, no hepatosplenomegaly or masses, no abdominal bruits or    hernia , no rebound or guarding.   Rectal: not performed Extremities: No lower extremity edema. No clubbing or deformities.  Neuro: Alert and oriented x 4 , grossly normal neurologically.  Skin: Warm and dry, no rash or jaundice.   Psych: Alert and cooperative, normal mood and affect.    Imaging Studies: Dg Eye  Foreign Body  10/27/2014   CLINICAL DATA:  Evaluate for metallic foreign body.  EXAM: ORBITS FOR FOREIGN BODY - 2 VIEW  COMPARISON:  None.  FINDINGS: There is no evidence of metallic foreign body within the orbits. No significant bone abnormality identified.  IMPRESSION: No evidence of metallic foreign body within the orbits.   Electronically Signed   By: Thomas  Register   On: 10/27/2014 09:01   Dg Neck Soft Tissue  10/12/2014   CLINICAL DATA:  Full body in the throat since today  EXAM: NECK SOFT TISSUES - 1+ VIEW  COMPARISON:  None.  FINDINGS: There is no evidence of retropharyngeal soft tissue swelling or epiglottic enlargement. The cervical airway is unremarkable and no radio-opaque foreign body identified. There is bilateral carotid artery atherosclerosis.  Degenerative disc disease at C3-4 with disc space narrowing.  IMPRESSION: Unremarkable neck soft tissues.  No radiopaque foreign body.     Electronically Signed   By: Hetal  Patel   On: 10/12/2014 20:13   Dg Chest 2 View  10/12/2014   CLINICAL DATA:  Foreign body in the throat since today.  EXAM: CHEST  2 VIEW  COMPARISON:  10/24/2011  FINDINGS: The heart size and mediastinal contours are within normal limits. Both lungs are clear. The visualized skeletal structures are unremarkable.  IMPRESSION: No active cardiopulmonary disease.   Electronically Signed   By: Hetal  Patel   On: 10/12/2014 20:13   Mr Lumbar Spine Wo Contrast  10/27/2014   CLINICAL DATA:  Back and right leg pain since May 2015. No known injury.  EXAM: MRI LUMBAR SPINE WITHOUT CONTRAST  TECHNIQUE: Multiplanar, multisequence MR imaging of the lumbar spine was performed. No intravenous contrast was administered.  COMPARISON:  CT scan 04/20/2011.  FINDINGS: Advanced degenerative lumbar spondylosis with multilevel disc disease and facet disease. Stable degenerative anterolisthesis of L5 due to severe facet disease. No definite pars defects. The last full intervertebral disc space is  labeled L5-S1 and the conus medullaris terminates at L1.  No significant paraspinal or retroperitoneal findings.  L1-2: Advanced degenerative disc disease with a bulging degenerated annulus and osteophytic ridging. There is also moderate facet disease. No significant spinal stenosis. Mild bilateral lateral recess encroachment and mild right foraminal encroachment.  L2-3: Degenerative disc disease and facet disease but no focal disc protrusion, spinal or foraminal stenosis.  L3-4: Small central disc protrusion with mild mass effect on the ventral thecal sac. There is also a bulging annulus and osteophytic ridging but no significant spinal or foraminal stenosis.  L4-5: Bulging uncovered disc with slight flattening of the ventral thecal sac but no significant spinal or lateral recess stenosis. There is a shallow broad-based extra foraminal and far lateral disc protrusion on the left potentially irritating the exiting left L4 nerve root.  L5-S1: Degenerative anterolisthesis of L5 with a bulging uncovered disc and osteophytic spurring. No significant spinal stenosis. There is also severe facet disease. There is bilateral foraminal stenosis, left greater than right.  IMPRESSION: 1. Advanced degenerative lumbar spondylosis with multilevel disc disease and facet disease. 2. Mild bilateral lateral recess encroachment and mild right foraminal encroachment at L1-2 3. Small central disc protrusion at L3-4 with minimal/mild mass effect on the ventral thecal sac. 4. Shallow broad-based extra foraminal and far lateral disc protrusion on the left at L4-5 potentially irritating the left L4 nerve root. 5. Degenerative anterolisthesis of L5 with severe disc disease and facet disease. There is bilateral foraminal stenosis, left greater than right.   Electronically Signed   By: Mark  Gallerani M.D.   On: 10/27/2014 11:17      

## 2014-11-04 NOTE — Assessment & Plan Note (Signed)
63 year old gentleman with recent food impaction requiring emergent EGD per Dr. Gala Romney. Diagnosed with eosinophilic esophagitis. Needs repeat EGD at this time with dilation.  I have discussed the risks, alternatives, benefits with regards to but not limited to the risk of reaction to medication, bleeding, infection, perforation and the patient is agreeable to proceed. Written consent to be obtained.  Patient will try to get [place] the name of physician he saw a few years ago for allergy testing so we can determine what he has been tested for. He may ultimately still need to go back for food allergy testing given eosinophilic esophagitis. At this time will hold off on treatment. Continue PPI therapy.

## 2014-11-16 ENCOUNTER — Encounter (HOSPITAL_COMMUNITY): Payer: Self-pay | Admitting: *Deleted

## 2014-11-16 ENCOUNTER — Ambulatory Visit (HOSPITAL_COMMUNITY)
Admission: RE | Admit: 2014-11-16 | Discharge: 2014-11-16 | Disposition: A | Discharge status: Home / Self Care | Payer: PRIVATE HEALTH INSURANCE | Source: Ambulatory Visit | Attending: Internal Medicine | Admitting: Internal Medicine

## 2014-11-16 ENCOUNTER — Encounter (HOSPITAL_COMMUNITY)
Admission: RE | Disposition: A | Discharge status: Home / Self Care | Payer: Self-pay | Source: Ambulatory Visit | Attending: Internal Medicine

## 2014-11-16 DIAGNOSIS — Z7982 Long term (current) use of aspirin: Secondary | ICD-10-CM | POA: Diagnosis not present

## 2014-11-16 DIAGNOSIS — E785 Hyperlipidemia, unspecified: Secondary | ICD-10-CM | POA: Insufficient documentation

## 2014-11-16 DIAGNOSIS — K449 Diaphragmatic hernia without obstruction or gangrene: Secondary | ICD-10-CM | POA: Diagnosis not present

## 2014-11-16 DIAGNOSIS — K222 Esophageal obstruction: Secondary | ICD-10-CM | POA: Diagnosis present

## 2014-11-16 DIAGNOSIS — K200 Eosinophilic esophagitis: Secondary | ICD-10-CM

## 2014-11-16 DIAGNOSIS — E119 Type 2 diabetes mellitus without complications: Secondary | ICD-10-CM | POA: Diagnosis not present

## 2014-11-16 DIAGNOSIS — K229 Disease of esophagus, unspecified: Secondary | ICD-10-CM

## 2014-11-16 HISTORY — PX: SAVORY DILATION: SHX5439

## 2014-11-16 HISTORY — PX: MALONEY DILATION: SHX5535

## 2014-11-16 HISTORY — PX: ESOPHAGOGASTRODUODENOSCOPY: SHX5428

## 2014-11-16 LAB — GLUCOSE, CAPILLARY: GLUCOSE-CAPILLARY: 160 mg/dL — AB (ref 70–99)

## 2014-11-16 SURGERY — EGD (ESOPHAGOGASTRODUODENOSCOPY)
Anesthesia: Moderate Sedation

## 2014-11-16 MED ORDER — ONDANSETRON HCL 4 MG/2ML IJ SOLN
INTRAMUSCULAR | Status: AC
  Filled 2014-11-16: qty 2

## 2014-11-16 MED ORDER — STERILE WATER FOR IRRIGATION IR SOLN
Status: DC | PRN
  Administered 2014-11-16: 09:00:00

## 2014-11-16 MED ORDER — LIDOCAINE VISCOUS 2 % MT SOLN
OROMUCOSAL | Status: DC | PRN
  Administered 2014-11-16: 3 mL via OROMUCOSAL

## 2014-11-16 MED ORDER — MIDAZOLAM HCL 5 MG/5ML IJ SOLN
INTRAMUSCULAR | Status: DC
Start: 2014-11-16 — End: 2014-11-16
  Filled 2014-11-16: qty 10

## 2014-11-16 MED ORDER — LIDOCAINE VISCOUS 2 % MT SOLN
OROMUCOSAL | Status: AC
  Filled 2014-11-16: qty 15

## 2014-11-16 MED ORDER — MEPERIDINE HCL 100 MG/ML IJ SOLN
INTRAMUSCULAR | Status: AC
  Filled 2014-11-16: qty 2

## 2014-11-16 MED ORDER — SODIUM CHLORIDE 0.9 % IV SOLN
INTRAVENOUS | Status: DC
  Administered 2014-11-16: 1000 mL via INTRAVENOUS

## 2014-11-16 MED ORDER — MEPERIDINE HCL 100 MG/ML IJ SOLN
INTRAMUSCULAR | Status: DC | PRN
  Administered 2014-11-16: 50 mg via INTRAVENOUS

## 2014-11-16 MED ORDER — MIDAZOLAM HCL 5 MG/5ML IJ SOLN
INTRAMUSCULAR | Status: DC | PRN
  Administered 2014-11-16: 2 mg via INTRAVENOUS
  Administered 2014-11-16: 1 mg via INTRAVENOUS

## 2014-11-16 MED ORDER — ONDANSETRON HCL 4 MG/2ML IJ SOLN
INTRAMUSCULAR | Status: DC | PRN
  Administered 2014-11-16: 4 mg via INTRAVENOUS

## 2014-11-16 NOTE — Interval H&P Note (Signed)
History and Physical Interval Note:  11/16/2014 8:29 AM  Micheal [name]  has presented today for surgery, with the diagnosis of esophageal stricture/eosinophilic  The various methods of treatment have been discussed with the patient and family. After consideration of risks, benefits and other options for treatment, the patient has consented to  Procedure(s) with comments: ESOPHAGOGASTRODUODENOSCOPY (EGD) (N/A) - 0830 SAVORY DILATION (N/A) MALONEY DILATION (N/A) as a surgical intervention .  The patient's history has been reviewed, patient examined, no change in status, stable for surgery.  I have reviewed the patient's chart and labs.  Questions were answered to the patient's satisfaction.     Micheal [name]  No change. EGD with dilation as appropriate per plan.The risks, benefits, limitations, alternatives and imponderables have been reviewed with the patient. Potential for esophageal dilation, biopsy, etc. have also been reviewed.  Questions have been answered. All parties agreeable.

## 2014-11-16 NOTE — Op Note (Signed)
[facility] [address] [place], [address]   ENDOSCOPY PROCEDURE REPORT  PATIENT: Micheal, [name]  MR#: [id number] BIRTHDATE: Aug 01, 1951 , 20  yrs. old GENDER: male ENDOSCOPIST: R.  Garfield Cornea, MD FACP FACG REFERRED BY:  Asencion Noble, M.D. PROCEDURE DATE:  2014/12/14 PROCEDURE:  EGD, diagnostic and Maloney dilation of esophagus INDICATIONS:  Recent food impaction; biopsy proven eosinophilic esophagitis; has done fairly well over the past 1 month with Protonix 40 mg daily; emergent EGD last month incomplete.Marland Kitchen MEDICATIONS: Versed 3 mg IV and Demerol 50 mg IV in divided doses. Xylocaine gel orally.  Zofran 4 mg IV. ASA CLASS:      Class II  CONSENT: The risks, benefits, limitations, alternatives and imponderables have been discussed.  The potential for biopsy, esophogeal dilation, etc. have also been reviewed.  Questions have been answered.  All parties agreeable.  Please see the history and physical in the medical record for more information.  DESCRIPTION OF PROCEDURE: After the risks benefits and alternatives of the procedure were thoroughly explained, informed consent was obtained.  The EG-[id number] ([id number]) endoscope was introduced through the mouth and advanced to the second portion of the duodenum , limited by Without limitations. The instrument was slowly withdrawn as the mucosa was fully examined.    Examination of the tubular esophagus revealed a patent esophagus throughout its course.  Subtle "ringing" mid body.  Tiny white fixed mucosal lesions - a few in the midesophagus consistent with eosinophilic microabscesses.  No esophagitis otherwise.  Scope easily traversed  the esophagus.  Stomach empty.  Small hiatal hernia.  Normal gastric mucosa.  Patent pylorus.  Normal-appearing first and second portion of the duodenum  Scope was removed, a 52 [place] Maloney dilator was passed with mild resistance to full insertion.  A look back revealed no  mucosal tears with this maneuver.  The patient tolerated the procedure well.  Retroflexed views revealed as previously described and Retroflexed views revealed a hiatal hernia.     The scope was then withdrawn from the patient and the procedure completed.  COMPLICATIONS: There were no immediate complications.  ENDOSCOPIC IMPRESSION: Abnormal esophagus consistent with EOE?"status post passage of a 52 [place] Maloney dilator. Hiatal hernia.  RECOMMENDATIONS: Continue Protonix 40 mg daily. Follow-up office appointment in 3 months with Dr. Laural Golden, the patient's primary gastroenterologist.   REPEAT EXAM:  eSigned:  R. Garfield Cornea, MD Rosalita [facility] 12-14-14 9:02 AM    CC:  CPT CODES: ICD CODES:  The ICD and CPT codes recommended by this software are interpretations from the data that the clinical staff has captured with the software.  The verification of the translation of this report to the ICD and CPT codes and modifiers is the sole responsibility of the health care institution and practicing physician where this report was generated.  [facility]. will not be held responsible for the validity of the ICD and CPT codes included on this report.  AMA assumes no liability for data contained or not contained herein. CPT is a Designer, television/film set of the [employer].  PATIENT NAME:  Micheal, [name] MR#: [id number]

## 2014-11-16 NOTE — Discharge Instructions (Signed)
EGD Discharge instructions Please read the instructions outlined below and refer to this sheet in the next few weeks. These discharge instructions provide you with general information on caring for yourself after you leave the hospital. Your doctor may also give you specific instructions. While your treatment has been planned according to the most current medical practices available, unavoidable complications occasionally occur. If you have any problems or questions after discharge, please call your doctor. ACTIVITY  You may resume your regular activity but move at a slower pace for the next 24 hours.   Take frequent rest periods for the next 24 hours.   Walking will help expel (get rid of) the air and reduce the bloated feeling in your abdomen.   No driving for 24 hours (because of the anesthesia (medicine) used during the test).   You may shower.   Do not sign any important legal documents or operate any machinery for 24 hours (because of the anesthesia used during the test).  NUTRITION  Drink plenty of fluids.   You may resume your normal diet.   Begin with a light meal and progress to your normal diet.   Avoid alcoholic beverages for 24 hours or as instructed by your caregiver.  MEDICATIONS  You may resume your normal medications unless your caregiver tells you otherwise.  WHAT YOU CAN EXPECT TODAY  You may experience abdominal discomfort such as a feeling of fullness or gas pains.  FOLLOW-UP  Your doctor will discuss the results of your test with you.  SEEK IMMEDIATE MEDICAL ATTENTION IF ANY OF THE FOLLOWING OCCUR:  Excessive nausea (feeling sick to your stomach) and/or vomiting.   Severe abdominal pain and distention (swelling).   Trouble swallowing.   Temperature over 101 F (37.8 C).   Rectal bleeding or vomiting of blood.    Continue Protonix 40 mg daily  Appointment to see Dr. Laural Golden in 3 months

## 2014-11-16 NOTE — H&P (View-Only) (Signed)
Primary Care Physician:  Asencion Noble, MD  Primary Gastroenterologist:  Garfield Cornea, MD   Chief Complaint  Patient presents with  . EGD    HPI:  Micheal [name] is a 63 y.o. male here to schedule a repeat EGD for esophageal dilation. Recently seen emergently on 10/12/2014 with question will food impaction. Patient states he felt the food dislodge a few minutes before the procedure, actually vomited the bolus up. At time of his EGD he had longitudinally furrowed esophagus with excoriations with a little bit of mucus and food debris at the GE junction. Esophageal mucosa was friable diffusely. Felt to have a probable noncritical stricture at the GE junction with possible ring component. Mid and distal esophagus was biopsied to evaluate for EOE, biopsies consistent with eosinophilic esophagitis.  Patient states he previously went to an allergist and is "allergic to everything". He is not sure whether he had food testing however. Going to try to get [place] the name of the physician so we can request records. Overall he feels well. Really hasn't had any issue swallowing. Denies heartburn, abdominal pain, constipation, diarrhea, melena, rectal bleeding, vomiting.   Patient states that he would like to consider going back to Dr. Laural Golden since he used to be his primary GI. He ended up seeing [place] again in 2012 when he was in need of having colonoscopy done prior to hernia surgery and we were able to facilitate this for him. Dr. Oneida Alar actually did the procedure since Dr. Gala Romney was out of town at the time. Dr. Gala Romney is listed as primary gastroenterologist for our practice. I did mention this to Dexter, our office manager, today and she stated that patient should make decision as far as who he will continue his GI care with.    Current Outpatient Prescriptions  Medication Sig Dispense Refill  . aspirin EC 81 MG tablet Take 81 mg by mouth daily.     Marland Kitchen lisinopril (PRINIVIL,ZESTRIL) 20 MG tablet Take 20 mg by mouth  daily.    . metFORMIN (GLUCOPHAGE-XR) 500 MG 24 hr tablet Take 1,000 mg by mouth daily with breakfast.     . pantoprazole (PROTONIX) 40 MG tablet Take 40 mg by mouth daily.      No current facility-administered medications for this visit.    Allergies as of 11/04/2014  . (No Known Allergies)    Past Medical History  Diagnosis Date  . Hyperlipidemia   . Diabetes mellitus     NIDDM  . Umbilical hernia   . Kidney stones   . Seasonal allergies   . Sleep apnea sleep study 12/08/2007    no CPAP use; uses a mouth guard at night    Past Surgical History  Procedure Laterality Date  . Colonoscopy  10/28/2006; 10/23/2011  . Cystoscopy w/ ureteral stent placement  04/12/2011    right retrograde; basketing of stone  . Umbilical hernia repair  10/26/2011    Procedure: HERNIA REPAIR UMBILICAL ADULT;  Surgeon: Rolm Bookbinder, MD;  Location: [facility];  Service: General;  Laterality: N/A;  umbilical hernia repair with mesh  . Colonoscopy  10/23/2011    SLF:  1). Internal hemorroids, small  2). POLYPOID LESION (AC) . next TCS 10/2016  . Hernia repair    . Esophagogastroduodenoscopy N/A 10/12/2014    RMR:  abnormal esophagus as described above query EOE status post biopsy; food impaction passes spontaneously with intubation of the esophagus c/w EOE, probable noncritical stricture at GEJ with possible ring component. food bolus  had passed.     Family History  Problem Relation Age of Onset  . Cancer Mother     colon, age 40 deceased  . Stroke Father     History   Social History  . Marital Status: Married    Spouse Name: N/A    Number of Children: 2  . Years of Education: N/A   Occupational History  . farmer    Social History Main Topics  . Smoking status: Never Smoker   . Smokeless tobacco: Never Used  . Alcohol Use: 0.0 oz/week    1-3 Cans of beer per week     Comment: occasionally  . Drug Use: No  . Sexual Activity: Not on file   Other Topics Concern  . Not  on file   Social History Narrative      ROS:  General: Negative for anorexia, weight loss, fever, chills, fatigue, weakness. Eyes: Negative for vision changes.  ENT: Negative for hoarseness, difficulty swallowing , nasal congestion. CV: Negative for chest pain, angina, palpitations, dyspnea on exertion, peripheral edema.  Respiratory: Negative for dyspnea at rest, dyspnea on exertion, cough, sputum, wheezing.  GI: See history of present illness. GU:  Negative for dysuria, hematuria, urinary incontinence, urinary frequency, nocturnal urination.  MS: Negative for joint pain, low back pain.  Derm: Negative for rash or itching.  Neuro: Negative for weakness, abnormal sensation, seizure, frequent headaches, memory loss, confusion.  Psych: Negative for anxiety, depression, suicidal ideation, hallucinations.  Endo: Negative for unusual weight change.  Heme: Negative for bruising or bleeding. Allergy: Negative for rash or hives.    Physical Examination:  BP 134/86 mmHg  Pulse 83  Temp(Src) 97.9 F (36.6 C) (Oral)  Ht 6\' 2"  (1.88 m)  Wt 312 lb 12.8 oz (141.885 kg)  BMI 40.14 kg/m2   General: Well-nourished, well-developed in no acute distress.  Head: Normocephalic, atraumatic.   Eyes: Conjunctiva pink, no icterus. Mouth: Oropharyngeal mucosa moist and pink , no lesions erythema or exudate. Neck: Supple without thyromegaly, masses, or lymphadenopathy.  Lungs: Clear to auscultation bilaterally.  Heart: Regular rate and rhythm, no murmurs rubs or gallops.  Abdomen: Bowel sounds are normal, nontender, nondistended, no hepatosplenomegaly or masses, no abdominal bruits or    hernia , no rebound or guarding.   Rectal: not performed Extremities: No lower extremity edema. No clubbing or deformities.  Neuro: Alert and oriented x 4 , grossly normal neurologically.  Skin: Warm and dry, no rash or jaundice.   Psych: Alert and cooperative, normal mood and affect.    Imaging Studies: Dg Eye  Foreign Body  10/27/2014   CLINICAL DATA:  Evaluate for metallic foreign body.  EXAM: ORBITS FOR FOREIGN BODY - 2 VIEW  COMPARISON:  None.  FINDINGS: There is no evidence of metallic foreign body within the orbits. No significant bone abnormality identified.  IMPRESSION: No evidence of metallic foreign body within the orbits.   Electronically Signed   By: Marcello Moores  Register   On: 10/27/2014 09:01   Dg Neck Soft Tissue  10/12/2014   CLINICAL DATA:  Full body in the throat since today  EXAM: NECK SOFT TISSUES - 1+ VIEW  COMPARISON:  None.  FINDINGS: There is no evidence of retropharyngeal soft tissue swelling or epiglottic enlargement. The cervical airway is unremarkable and no radio-opaque foreign body identified. There is bilateral carotid artery atherosclerosis.  Degenerative disc disease at C3-4 with disc space narrowing.  IMPRESSION: Unremarkable neck soft tissues.  No radiopaque foreign body.  Electronically Signed   By: Kathreen Devoid   On: 10/12/2014 20:13   Dg Chest 2 View  10/12/2014   CLINICAL DATA:  Foreign body in the throat since today.  EXAM: CHEST  2 VIEW  COMPARISON:  10/24/2011  FINDINGS: The heart size and mediastinal contours are within normal limits. Both lungs are clear. The visualized skeletal structures are unremarkable.  IMPRESSION: No active cardiopulmonary disease.   Electronically Signed   By: Kathreen Devoid   On: 10/12/2014 20:13   Mr Lumbar Spine Wo Contrast  10/27/2014   CLINICAL DATA:  Back and right leg pain since May 2015. No known injury.  EXAM: MRI LUMBAR SPINE WITHOUT CONTRAST  TECHNIQUE: Multiplanar, multisequence MR imaging of the lumbar spine was performed. No intravenous contrast was administered.  COMPARISON:  CT scan 04/20/2011.  FINDINGS: Advanced degenerative lumbar spondylosis with multilevel disc disease and facet disease. Stable degenerative anterolisthesis of L5 due to severe facet disease. No definite pars defects. The last full intervertebral disc space is  labeled L5-S1 and the conus medullaris terminates at L1.  No significant paraspinal or retroperitoneal findings.  L1-2: Advanced degenerative disc disease with a bulging degenerated annulus and osteophytic ridging. There is also moderate facet disease. No significant spinal stenosis. Mild bilateral lateral recess encroachment and mild right foraminal encroachment.  L2-3: Degenerative disc disease and facet disease but no focal disc protrusion, spinal or foraminal stenosis.  L3-4: Small central disc protrusion with mild mass effect on the ventral thecal sac. There is also a bulging annulus and osteophytic ridging but no significant spinal or foraminal stenosis.  L4-5: Bulging uncovered disc with slight flattening of the ventral thecal sac but no significant spinal or lateral recess stenosis. There is a shallow broad-based extra foraminal and far lateral disc protrusion on the left potentially irritating the exiting left L4 nerve root.  L5-S1: Degenerative anterolisthesis of L5 with a bulging uncovered disc and osteophytic spurring. No significant spinal stenosis. There is also severe facet disease. There is bilateral foraminal stenosis, left greater than right.  IMPRESSION: 1. Advanced degenerative lumbar spondylosis with multilevel disc disease and facet disease. 2. Mild bilateral lateral recess encroachment and mild right foraminal encroachment at L1-2 3. Small central disc protrusion at L3-4 with minimal/mild mass effect on the ventral thecal sac. 4. Shallow broad-based extra foraminal and far lateral disc protrusion on the left at L4-5 potentially irritating the left L4 nerve root. 5. Degenerative anterolisthesis of L5 with severe disc disease and facet disease. There is bilateral foraminal stenosis, left greater than right.   Electronically Signed   By: Kalman Jewels M.D.   On: 10/27/2014 11:17

## 2014-11-17 ENCOUNTER — Encounter (HOSPITAL_COMMUNITY): Payer: Self-pay | Admitting: Internal Medicine

## 2014-11-18 NOTE — Progress Notes (Signed)
cc'ed to pcp °

## 2014-12-02 ENCOUNTER — Encounter (HOSPITAL_COMMUNITY): Payer: Self-pay | Admitting: Internal Medicine

## 2014-12-15 NOTE — Progress Notes (Addendum)
Reviewed allergy testing records from 2011. He had multiple allergies to dog/cat/mold/trees/grasses/dust mites.  Negative scratch test for foods.   Patient has follow up with Dr. Laural Golden coming up as he wants to continue his care with him, he was previously a remote patient of Dr. Laural Golden.

## 2015-02-21 ENCOUNTER — Encounter (INDEPENDENT_AMBULATORY_CARE_PROVIDER_SITE_OTHER): Payer: Self-pay | Admitting: Internal Medicine

## 2015-02-21 ENCOUNTER — Ambulatory Visit (INDEPENDENT_AMBULATORY_CARE_PROVIDER_SITE_OTHER): Payer: BLUE CROSS/BLUE SHIELD | Admitting: Internal Medicine

## 2015-02-21 VITALS — BP 132/78 | HR 82 | Temp 98.5°F | Resp 18 | Ht 74.0 in | Wt 319.8 lb

## 2015-02-21 DIAGNOSIS — K200 Eosinophilic esophagitis: Secondary | ICD-10-CM | POA: Diagnosis not present

## 2015-02-21 DIAGNOSIS — Z86010 Personal history of colonic polyps: Secondary | ICD-10-CM | POA: Diagnosis not present

## 2015-02-21 DIAGNOSIS — K219 Gastro-esophageal reflux disease without esophagitis: Secondary | ICD-10-CM | POA: Diagnosis not present

## 2015-02-21 NOTE — Progress Notes (Signed)
Presenting complaint;  Follow-up for eosinophilic esophagitis.  Database;  Patient is 64 year old Caucasian male was here for scheduled visit. He presented back in November 2015 with esophageal food impaction and underwent EGD by by Dr. Gala Romney. He was confirmed to have eosinophilic esophagitis. He returned for repeat EGD on 11/16/2014 and esophagus was dilated by him with 52 French Maloney dilator but no mucosal disruption was induced. Patient had allergy testing about 8 years ago and he was positive for multiple agents. Patient also has history of colonic adenomas and family history of colon carcinoma. Last colonoscopy was in December 2012. Family history is positive for CRC in mother at age 61. He has a brother and a sister with negative history for polyps.  Subjective;  Patient feels fine. He has not experienced dysphagia since his esophageal dilation about 3 months ago. He has heartburn no more than once or twice a month with certain foods. He denies chronic cough or hoarseness or sore throat. He denies abdominal pain melena or rectal bleeding. His bowels move daily. He has good appetite. He has gained 7 pounds since he was at [place] on 11/04/2014. He is fine to walk more often in order to lose weight. Presently he is walking 1 mile 2-3 times a week.   Current Medications: Outpatient Encounter Prescriptions as of 02/21/2015  Medication Sig  . aspirin EC 81 MG tablet Take 81 mg by mouth daily.   . fluticasone (FLONASE) 50 MCG/ACT nasal spray Place 1 spray into both nostrils as needed for allergies or rhinitis.  Marland Kitchen ibuprofen (ADVIL,MOTRIN) 600 MG tablet Take 600 mg by mouth as needed.   Marland Kitchen lisinopril (PRINIVIL,ZESTRIL) 20 MG tablet Take 20 mg by mouth daily.  . magnesium oxide (MAG-OX) 400 MG tablet Take 400 mg by mouth daily.  . metFORMIN (GLUCOPHAGE-XR) 500 MG 24 hr tablet Take 1,000 mg by mouth daily with breakfast.   . pantoprazole (PROTONIX) 40 MG tablet Take 40 mg by mouth daily.       Objective: Blood pressure 132/78, pulse 82, temperature 98.5 F (36.9 C), temperature source Oral, resp. rate 18, height 6\' 2"  (1.88 m), weight 319 lb 12.8 oz (145.06 kg). Patient is alert and in no acute distress. Conjunctiva is pink. Sclera is nonicteric Oropharyngeal mucosa is normal. No neck masses or thyromegaly noted. Cardiac exam with regular rhythm normal S1 and S2. No murmur or gallop noted. Lungs are clear to auscultation. Abdomen is obese but soft and nontender without organomegaly or masses.  No LE edema or clubbing noted.   Assessment:  #1. Eosinophilic esophagitis. He has not experienced dysphagia since this esophagus was dilated 13 weeks ago. He is admitted increased risk for esophageal stricture and recurrent dysphagia. I do not believe eosinophilic esophagitis is secondary to GERD given history of multiple  allergies. Therefore he needs to be treated with topical steroid. #2. GERD. Heartburn is well controlled with therapy.  #3. History of colonic adenomas and family history of CRC in mother at late onset. I performed his first colonoscopy in 2007 Dr. Gala Romney examined him in December 2012. #4. Obesity. Patient needs to walk daily at least 30 minutes each time. If he is not able to lose weight he may benefit from dietary counseling.   Plan:  Fluticasone 880 g twice a day for 6 weeks. Patient instructed on how to use this medication. Patient informed he may develop oropharyngeal candidiasis in which case he would need to call office for Mycostatin prescription. Office visit in 1 year.  Next colonoscopy would be in December 2017.

## 2015-02-22 ENCOUNTER — Telehealth (INDEPENDENT_AMBULATORY_CARE_PROVIDER_SITE_OTHER): Payer: Self-pay | Admitting: *Deleted

## 2015-02-22 NOTE — Telephone Encounter (Signed)
Patient left message stating medicine you gave him yesterday, he took one last night and was ok then took one this morning after eating breakfast and 1-2 hours later upper lip and face swelled some but is going down now -- wanted you to know side effects he was having and wants you to call him please @ [phone]

## 2015-02-22 NOTE — Telephone Encounter (Signed)
Patient's called returned He had no problems with fluticasone last evening. This may be reaction to medication or unrelated He did not have breathing difficulty He will try taking the medication again starting Thursday morning and let me know.

## 2015-05-02 ENCOUNTER — Other Ambulatory Visit: Payer: Self-pay | Admitting: Internal Medicine

## 2015-05-02 NOTE — Telephone Encounter (Signed)
This a Dr. Laural Golden pt. Routing the refill to him.

## 2015-05-03 NOTE — Telephone Encounter (Signed)
Patient should request refills from Dr. Laural Golden. He has reestablished care at their practice.

## 2015-05-04 NOTE — Telephone Encounter (Signed)
I called patient. He has completed six weeks of fluticasone and is having no swallowing difficulty whatsoever. Patient will call of dysphagia relapses otherwise return for office visit in April next year.

## 2015-07-12 ENCOUNTER — Encounter: Payer: Self-pay | Admitting: Pulmonary Disease

## 2015-07-12 ENCOUNTER — Ambulatory Visit (INDEPENDENT_AMBULATORY_CARE_PROVIDER_SITE_OTHER): Payer: BLUE CROSS/BLUE SHIELD | Admitting: Pulmonary Disease

## 2015-07-12 VITALS — BP 132/86 | HR 93 | Ht 75.0 in | Wt 327.0 lb

## 2015-07-12 DIAGNOSIS — G4733 Obstructive sleep apnea (adult) (pediatric): Secondary | ICD-10-CM

## 2015-07-12 NOTE — Patient Instructions (Signed)
We will arrange a sleep study Try to lose weight Do not drive or operate heavy equipment while feeling sleepy When you come back we will have you see one of our sleep specialists

## 2015-07-12 NOTE — Progress Notes (Signed)
Subjective:    Patient ID: Micheal [name], male    DOB: Sep 17, 1951, 64 y.o.   MRN: [id number]  HPI Chief Complaint  Patient presents with  . Advice Only    old [place] pt referred back to practice from Dr. Willey Blade for cough. Pt states he's here for sleep apnea: had sleep study at [facility] and never followed up with cpap therapy.    This is a very pleasant 64 year old Micheal [name] comes to our clinic today for evaluation of sleepiness. He was previously followed by my partner many years ago for sleep apnea. Apparently he saw Dr. Gwenette Greet only one time for this. He was diagnosed with obstructive sleep apnea in 2008 or 2009 after a sleep study confirmed the diagnosis. He tried to treating it with weight loss but he was unsuccessful with this. He has noted increasing fatigue over the last several months. This is primarily manifested as sleepiness. He has inability to focus on paperwork in the afternoons. He denies morning headaches. He says he's never had a car accident but he does note feeling sleepy while driving. He does not currently use CPAP nor was he ever prescribed. He noted feeling claustrophobic with the fullface mask that was given to him 8 or 9 years ago. Has witnessed apneas,    Past Medical History  Diagnosis Date  . Hyperlipidemia   . Diabetes mellitus     NIDDM  . Umbilical hernia   . Kidney stones   . Seasonal allergies   . Sleep apnea sleep study 12/08/2007    no CPAP use; uses a mouth guard at night     Family History  Problem Relation Age of Onset  . Cancer Mother     colon, age 41 deceased  . Stroke Father      Social History   Social History  . Marital Status: Married    Spouse Name: N/A  . Number of Children: 2  . Years of Education: N/A   Occupational History  . farmer    Social History Main Topics  . Smoking status: Never Smoker   . Smokeless tobacco: Never Used  . Alcohol Use: 0.0 oz/week    1-3 Cans of beer per week     Comment: occasionally  . Drug Use: No  .  Sexual Activity: Yes   Other Topics Concern  . Not on file   Social History Narrative     No Known Allergies   Outpatient Prescriptions Prior to Visit  Medication Sig Dispense Refill  . aspirin EC 81 MG tablet Take 81 mg by mouth daily.     . fluticasone (FLONASE) 50 MCG/ACT nasal spray Place 1 spray into both nostrils as needed for allergies or rhinitis.    Marland Kitchen ibuprofen (ADVIL,MOTRIN) 600 MG tablet Take 600 mg by mouth as needed.   0  . lisinopril (PRINIVIL,ZESTRIL) 20 MG tablet Take 20 mg by mouth daily.    . magnesium oxide (MAG-OX) 400 MG tablet Take 400 mg by mouth daily.    . metFORMIN (GLUCOPHAGE-XR) 500 MG 24 hr tablet Take 1,000 mg by mouth daily with breakfast.     . pantoprazole (PROTONIX) 40 MG tablet Take 40 mg by mouth daily.      No facility-administered medications prior to visit.     Review of Systems  Constitutional: Negative for fever and unexpected weight change.  HENT: Positive for congestion. Negative for dental problem, ear pain, nosebleeds, postnasal drip, rhinorrhea, sinus pressure, sneezing, sore throat and trouble swallowing.  Eyes: Negative for redness and itching.  Respiratory: Positive for cough and shortness of breath. Negative for chest tightness and wheezing.   Cardiovascular: Negative for palpitations and leg swelling.  Gastrointestinal: Negative for nausea and vomiting.  Genitourinary: Negative for dysuria.  Musculoskeletal: Negative for joint swelling.  Skin: Negative for rash.  Neurological: Negative for headaches.  Hematological: Does not bruise/bleed easily.  Psychiatric/Behavioral: Negative for dysphoric mood. The patient is not nervous/anxious.        Objective:   Physical Exam Filed Vitals:   07/12/15 1522  BP: 132/86  Pulse: 93  Height: 6\' 3"  (1.905 m)  Weight: 327 lb (148.326 kg)  SpO2: 98%   RA  Gen: obese but well appearing, no acute distress HENT: NCAT, OP clear, neck supple without masses, thick neck, Mallampatti  score 4 Eyes: PERRL, EOM Lymph: no cervical lymphadenopathy PULM: CTA B CV: RRR, no mgr, no JVD GI: BS+, soft, nontender, no hsm Derm: no rash or skin breakdown MSK: normal bulk and tone Neuro: A&Ox4, CN II-XII intact, strength 5/5 in all 4 extremities Psyche: normal mood and affect        Assessment & Plan:  Obstructive sleep apnea Micheal [name] was sent to me today for evaluation of sleep apnea. He had been evaluated by this years ago apparently by one of my partners after a positive sleep study was performed around 2008. He said that he tried to treat it with weight loss but unfortunately he was unsuccessful with this. Since then he's had progression of symptoms of sleepiness. He describes daytime somnolence, heavy sleepiness in the afternoons, lack of focus when trying to do paperwork in the afternoon. He denies headaches. He is obese, he has a thick neck, and a high Mallampatti score of which are worse strongly consistent with his diagnosis of obstructive sleep apnea.  Plan: Advised to lose weight Instructed not to drive or use heavy machinery when feeling sleepy Split-night study Follow-up with sleep specialist in our clinic upon return    Current outpatient prescriptions:  .  aspirin EC 81 MG tablet, Take 81 mg by mouth daily. , Disp: , Rfl:  .  fluticasone (FLONASE) 50 MCG/ACT nasal spray, Place 1 spray into both nostrils as needed for allergies or rhinitis., Disp: , Rfl:  .  ibuprofen (ADVIL,MOTRIN) 600 MG tablet, Take 600 mg by mouth as needed. , Disp: , Rfl: 0 .  lisinopril (PRINIVIL,ZESTRIL) 20 MG tablet, Take 20 mg by mouth daily., Disp: , Rfl:  .  magnesium oxide (MAG-OX) 400 MG tablet, Take 400 mg by mouth daily., Disp: , Rfl:  .  metFORMIN (GLUCOPHAGE-XR) 500 MG 24 hr tablet, Take 1,000 mg by mouth daily with breakfast. , Disp: , Rfl:

## 2015-07-12 NOTE — Assessment & Plan Note (Signed)
Mr. Sluka was sent to me today for evaluation of sleep apnea. He had been evaluated by this years ago apparently by one of my partners after a positive sleep study was performed around 2008. He said that he tried to treat it with weight loss but unfortunately he was unsuccessful with this. Since then he's had progression of symptoms of sleepiness. He describes daytime somnolence, heavy sleepiness in the afternoons, lack of focus when trying to do paperwork in the afternoon. He denies headaches. He is obese, he has a thick neck, and a high Mallampatti score of which are worse strongly consistent with his diagnosis of obstructive sleep apnea.  Plan: Advised to lose weight Instructed not to drive or use heavy machinery when feeling sleepy Split-night study Follow-up with sleep specialist in our clinic upon return

## 2015-09-27 ENCOUNTER — Ambulatory Visit (HOSPITAL_BASED_OUTPATIENT_CLINIC_OR_DEPARTMENT_OTHER): Payer: BLUE CROSS/BLUE SHIELD | Attending: Pulmonary Disease

## 2015-09-27 DIAGNOSIS — G4733 Obstructive sleep apnea (adult) (pediatric): Secondary | ICD-10-CM | POA: Diagnosis present

## 2015-10-03 DIAGNOSIS — G4733 Obstructive sleep apnea (adult) (pediatric): Secondary | ICD-10-CM | POA: Diagnosis not present

## 2015-10-03 NOTE — Progress Notes (Signed)
Patient Name: Micheal [name], Micheal [name] Date: 09/27/2015 Gender: Male D.O.B: 02-03-1951 Age (years): 64 Referring Provider: Simonne Maffucci Height (inches): 48 Interpreting Physician: Chesley Mires MD, ABSM Weight (lbs): 298 RPSGT: Joni Reining BMI: 50 MRN: [id number] Neck Size: 19.50  CLINICAL INFORMATION Sleep Study Type: Split Night CPAP Indication for sleep study: OSA Epworth Sleepiness Score: 10  SLEEP STUDY TECHNIQUE As per the AASM Manual for the Scoring of Sleep and Associated Events v2.3 (April 2016) with a hypopnea requiring 4% desaturations. The channels recorded and monitored were frontal, central and occipital EEG, electrooculogram (EOG), submentalis EMG (chin), nasal and oral airflow, thoracic and abdominal wall motion, anterior tibialis EMG, snore microphone, electrocardiogram, and pulse oximetry. Continuous positive airway pressure (CPAP) was initiated when the patient met split night criteria and was titrated according to treat sleep-disordered breathing.  MEDICATIONS Medications taken by the patient : reviewed in electronic medical record. Medications administered by patient during sleep study : No sleep medicine administered.  RESPIRATORY PARAMETERS Diagnostic Total AHI (/hr): 62.4 RDI (/hr): 62.9 OA Index (/hr): 15.7 CA Index (/hr): 1.0 REM AHI (/hr): 62.5 NREM AHI (/hr): 62.4 Supine AHI (/hr): N/A Non-supine AHI (/hr): 62.38 Min O2 Sat (%): 68.00 Mean O2 (%): 89.88 Time below 88% (min): 31.6     Titration Optimal Pressure (cm): 11 AHI at Optimal Pressure (/hr): 0 Min O2 at Optimal Pressure (%): 91.00  SLEEP ARCHITECTURE The recording time for the entire night was 364.1 minutes. During a baseline period of 173.6 minutes, the patient slept for 126.0 minutes in REM and nonREM, yielding a sleep efficiency of 72.6%. Sleep onset after lights out was 12.5 minutes with a REM latency of 90.5 minutes. The patient spent 3.97% of the night in stage N1 sleep, 76.98% in stage N2  sleep, 0.00% in stage N3 and 19.05% in REM. During the titration period of 168.9 minutes, the patient slept for 105.1 minutes in REM and nonREM, yielding a sleep efficiency of 62.2%. Sleep onset after CPAP initiation was 62.8 minutes with a REM latency of 27.5 minutes. The patient spent 4.28% of the night in stage N1 sleep, 37.11% in stage N2 sleep, 11.50% in stage N3 and 47.10% in REM.  CARDIAC DATA The 2 lead EKG demonstrated sinus rhythm. The mean heart rate was 74.43 beats per minute. Other EKG findings include: None.  LEG MOVEMENT DATA The total Periodic Limb Movements of Sleep (PLMS) were 0. The PLMS index was 0.00 .  IMPRESSIONS This study showed severe obstructive sleep apnea with an AHI of 62.4 and SaO2 low of 68%.  He did well with CPAP 11 cm H2O.   DIAGNOSIS - Obstructive Sleep Apnea (327.23 [G47.33 ICD-10])  RECOMMENDATIONS In addition to weight loss, he should be tried on CPAP 11 cm H2O.   Chesley Mires, MD, [place], American Board of Sleep Medicine 10/03/2015, 4:43 PM  NPI: [id number]

## 2015-10-05 ENCOUNTER — Telehealth: Payer: Self-pay

## 2015-10-05 DIAGNOSIS — G4733 Obstructive sleep apnea (adult) (pediatric): Secondary | ICD-10-CM

## 2015-10-05 NOTE — Telephone Encounter (Signed)
A,    He needs an order for CPAP 11cm H2O    Thanks    B    Spoke with pt to relay sleep study results and recs.  Pt aware.  Pt requests this order be sent through [facility].  A notation has been made on order.  Nothing further needed at this time.

## 2015-10-11 ENCOUNTER — Encounter: Payer: Self-pay | Admitting: Pulmonary Disease

## 2015-10-11 ENCOUNTER — Ambulatory Visit (INDEPENDENT_AMBULATORY_CARE_PROVIDER_SITE_OTHER): Payer: BLUE CROSS/BLUE SHIELD | Admitting: Pulmonary Disease

## 2015-10-11 VITALS — BP 120/64 | HR 92 | Ht 75.0 in | Wt 317.6 lb

## 2015-10-11 DIAGNOSIS — G4733 Obstructive sleep apnea (adult) (pediatric): Secondary | ICD-10-CM | POA: Diagnosis not present

## 2015-10-11 NOTE — Patient Instructions (Signed)
Do not drive or operate heavy equipment while feeling sleepy Use your CPAP machine every night Let us know if your having problems with the machine We will see you back in 6 months or sooner if needed

## 2015-10-11 NOTE — Progress Notes (Signed)
   Subjective:    Patient ID: Micheal [name], male    DOB: Apr 02, 1951, 64 y.o.   MRN: [id number]  Synopsis: Micheal [name] has OSA diagnosed with a sleep study in 2016 09/2015 PSG> CPAP 11cmH20; Total AHI (/hr):62.4, O2 sat nadir 68%  HPI Chief Complaint  Patient presents with  . Follow-up    pt. states he plans to start CPAP tonight. no new concerns.   Micheal [name] says that he had his polysomnogram and he did well with the CPAP during the test. He has obtained a machine but he has not started using it because he does not have distilled water yet. He plans to use it for the first time tonight. He continues to have problems with sleepiness. Otherwise things are okay.  Past Medical History  Diagnosis Date  . Hyperlipidemia   . Diabetes mellitus     NIDDM  . Umbilical hernia   . Kidney stones   . Seasonal allergies   . Sleep apnea sleep study 12/08/2007    no CPAP use; uses a mouth guard at night      Review of Systems     Objective:   Physical Exam Filed Vitals:   10/11/15 1434  BP: 120/64  Pulse: 92  Height: 6\' 3"  (1.905 m)  Weight: 317 lb 9.6 oz (144.062 kg)  SpO2: 94%    Gen: obese but well appearing HENT: OP clear, TM's clear, neck supple PULM: CTA B, normal percussion CV: RRR, no mgr, trace edema GI: BS+, soft, nontender Derm: no cyanosis or rash Psyche: normal mood and affect       Assessment & Plan:  Obstructive sleep apnea He was diagnosed with severe obstructive sleep apnea with an O2 saturation nadir of 68% on the November 2016 polysomnogram. I explained to him today that this puts him at increased risk for heart rhythm abnormalities, stroke, and heart failure. He was advised today at length that CPAP compliance is helpful for his long-term health. He was advised not to drive while feeling sleepy. He was advised not operate heavy equipment all feeling sleepy. He was advised to lose weight.  Plan: Continue CPAP every night Obtain download compliance  report in 3 months Follow-up 6 months or sooner if needed     Current outpatient prescriptions:  .  aspirin EC 81 MG tablet, Take 81 mg by mouth daily. , Disp: , Rfl:  .  fluticasone (FLONASE) 50 MCG/ACT nasal spray, Place 1 spray into both nostrils as needed for allergies or rhinitis., Disp: , Rfl:  .  ibuprofen (ADVIL,MOTRIN) 600 MG tablet, Take 600 mg by mouth as needed. , Disp: , Rfl: 0 .  lisinopril (PRINIVIL,ZESTRIL) 20 MG tablet, Take 20 mg by mouth daily., Disp: , Rfl:  .  magnesium oxide (MAG-OX) 400 MG tablet, Take 400 mg by mouth daily., Disp: , Rfl:  .  metFORMIN (GLUCOPHAGE-XR) 500 MG 24 hr tablet, Take 1,000 mg by mouth daily with breakfast. , Disp: , Rfl:

## 2015-10-11 NOTE — Assessment & Plan Note (Signed)
He was diagnosed with severe obstructive sleep apnea with an O2 saturation nadir of 68% on the November 2016 polysomnogram. I explained to him today that this puts him at increased risk for heart rhythm abnormalities, stroke, and heart failure. He was advised today at length that CPAP compliance is helpful for his long-term health. He was advised not to drive while feeling sleepy. He was advised not operate heavy equipment all feeling sleepy. He was advised to lose weight.  Plan: Continue CPAP every night Obtain download compliance report in 3 months Follow-up 6 months or sooner if needed

## 2015-12-02 ENCOUNTER — Encounter (INDEPENDENT_AMBULATORY_CARE_PROVIDER_SITE_OTHER): Payer: Self-pay | Admitting: Internal Medicine

## 2016-02-21 ENCOUNTER — Encounter (INDEPENDENT_AMBULATORY_CARE_PROVIDER_SITE_OTHER): Payer: Self-pay | Admitting: Internal Medicine

## 2016-02-21 ENCOUNTER — Ambulatory Visit (INDEPENDENT_AMBULATORY_CARE_PROVIDER_SITE_OTHER): Payer: Medicare Other | Admitting: Internal Medicine

## 2016-02-21 VITALS — BP 126/82 | HR 72 | Temp 98.2°F | Resp 18 | Ht 74.0 in | Wt 319.5 lb

## 2016-02-21 DIAGNOSIS — E669 Obesity, unspecified: Secondary | ICD-10-CM | POA: Insufficient documentation

## 2016-02-21 DIAGNOSIS — Z86010 Personal history of colonic polyps: Secondary | ICD-10-CM | POA: Diagnosis not present

## 2016-02-21 DIAGNOSIS — K200 Eosinophilic esophagitis: Secondary | ICD-10-CM

## 2016-02-21 NOTE — Progress Notes (Signed)
Presenting complaint;  Follow-up for eosinophilic esophagitis. History of colonic polyps.  Subjective:  Micheal [name] is 65 year old Caucasian male who is here for scheduled visit. He was last seen one year ago. He has history of eosinophilic esophagitis and EGD was in December 2015. He was last treated with fluticasone following his visit in April 2016. He states he is doing well. Only time he has swallowing difficulty is when he is in a hurry and does not chew his food. Food bolus always passes down. He has not had any episode of food impaction and regurgitation. He rarely has heartburn. He stays active but does not do any scheduled exercise. He has not lost any weight since his last visit. He has history of colonic adenomas. He had 2 adenomas removed back in 2007 but none found in December 2012. Family history significant for CRC in mother.   Current Medications: Outpatient Encounter Prescriptions as of 02/21/2016  Medication Sig  . aspirin EC 81 MG tablet Take 81 mg by mouth daily.   Marland Kitchen glimepiride (AMARYL) 1 MG tablet Take 1 mg by mouth daily with breakfast.  . ibuprofen (ADVIL,MOTRIN) 600 MG tablet Take 600 mg by mouth as needed.   Marland Kitchen lisinopril (PRINIVIL,ZESTRIL) 20 MG tablet Take 20 mg by mouth daily.  . magnesium oxide (MAG-OX) 400 MG tablet Take 400 mg by mouth daily.  . metFORMIN (GLUCOPHAGE-XR) 500 MG 24 hr tablet Take 2,000 mg by mouth daily with breakfast.   . [DISCONTINUED] fluticasone (FLONASE) 50 MCG/ACT nasal spray Place 1 spray into both nostrils as needed for allergies or rhinitis. Reported on 02/21/2016   No facility-administered encounter medications on file as of 02/21/2016.     Objective: Blood pressure 126/82, pulse 72, temperature 98.2 F (36.8 C), temperature source Oral, resp. rate 18, height 6\' 2"  (1.88 m), weight 319 lb 8 oz (144.924 kg). Patient is alert and in no acute distress. Conjunctiva is pink. Sclera is nonicteric Oropharyngeal mucosa is normal. No neck masses  or thyromegaly noted. Cardiac exam with regular rhythm normal S1 and S2. No murmur or gallop noted. Lungs are clear to auscultation. Abdomen is obese but soft and nontender without organomegaly or masses.  No LE edema or clubbing noted.   Assessment:  #1. Eosinophilic esophagitis. He underwent EGD with dilation in December 2015 and was treated with 6 weeks of fluticasone in April last year. He is having minimal symptoms if any. #2. History of colonic adenomas and family history of colon cancer in first-degree relative. Last colonoscopy was in December 2012. #3. Obesity.   Plan:  Patient will call if dysphagia recurs. Surveillance colonoscopy in December 2017. Patient advised to exercise or walk regularly and must monitor calorie intake. If he is not able to lose weight on his own he may review other options. Office visit in one year.

## 2016-02-21 NOTE — Patient Instructions (Signed)
Colonoscopy be scheduled in December 2017. Remember to exercise and walk at least 4 times a week if not daily for minimum of 30 minutes each time.

## 2016-04-12 ENCOUNTER — Ambulatory Visit: Payer: BLUE CROSS/BLUE SHIELD | Admitting: Pulmonary Disease

## 2016-05-01 DIAGNOSIS — E119 Type 2 diabetes mellitus without complications: Secondary | ICD-10-CM | POA: Diagnosis not present

## 2016-05-01 DIAGNOSIS — E785 Hyperlipidemia, unspecified: Secondary | ICD-10-CM | POA: Diagnosis not present

## 2016-05-01 DIAGNOSIS — Z79899 Other long term (current) drug therapy: Secondary | ICD-10-CM | POA: Diagnosis not present

## 2016-05-01 DIAGNOSIS — I10 Essential (primary) hypertension: Secondary | ICD-10-CM | POA: Diagnosis not present

## 2016-05-01 DIAGNOSIS — Z125 Encounter for screening for malignant neoplasm of prostate: Secondary | ICD-10-CM | POA: Diagnosis not present

## 2016-05-01 DIAGNOSIS — G4730 Sleep apnea, unspecified: Secondary | ICD-10-CM | POA: Diagnosis not present

## 2016-05-01 DIAGNOSIS — E669 Obesity, unspecified: Secondary | ICD-10-CM | POA: Diagnosis not present

## 2016-05-04 DIAGNOSIS — E1129 Type 2 diabetes mellitus with other diabetic kidney complication: Secondary | ICD-10-CM | POA: Diagnosis not present

## 2016-05-04 DIAGNOSIS — I10 Essential (primary) hypertension: Secondary | ICD-10-CM | POA: Diagnosis not present

## 2016-05-04 DIAGNOSIS — E785 Hyperlipidemia, unspecified: Secondary | ICD-10-CM | POA: Diagnosis not present

## 2016-05-04 DIAGNOSIS — Z23 Encounter for immunization: Secondary | ICD-10-CM | POA: Diagnosis not present

## 2016-05-04 DIAGNOSIS — Z6841 Body Mass Index (BMI) 40.0 and over, adult: Secondary | ICD-10-CM | POA: Diagnosis not present

## 2016-05-08 DIAGNOSIS — M79642 Pain in left hand: Secondary | ICD-10-CM | POA: Diagnosis not present

## 2016-06-05 DIAGNOSIS — M79642 Pain in left hand: Secondary | ICD-10-CM | POA: Diagnosis not present

## 2016-06-11 ENCOUNTER — Ambulatory Visit (INDEPENDENT_AMBULATORY_CARE_PROVIDER_SITE_OTHER): Payer: Medicare Other | Admitting: Pulmonary Disease

## 2016-06-11 ENCOUNTER — Encounter: Payer: Self-pay | Admitting: Pulmonary Disease

## 2016-06-11 DIAGNOSIS — G4733 Obstructive sleep apnea (adult) (pediatric): Secondary | ICD-10-CM | POA: Diagnosis not present

## 2016-06-11 NOTE — Assessment & Plan Note (Signed)
This is been a stable interval for him and he remains compliant with his CPAP therapy. He has been experiencing some sinus symptoms which can happen with CPAP.  Plan: Saline gel Continue CPAP at night Request download He was counseled to avoid alcohol and caffeine He is counseled to lose weight He was advised not to drive off feeling sleepy Follow-up one year

## 2016-06-11 NOTE — Patient Instructions (Signed)
I recommend that she use saline gel in each nostril before applying CPAP Keep using CPAP every night I'll see you back in one year or sooner if needed

## 2016-06-11 NOTE — Progress Notes (Signed)
   Subjective:    Patient ID: Micheal [name]., male    DOB: 04-Jan-1951, 65 y.o.   MRN: [id number]  Synopsis: Mr. Bosher has OSA diagnosed with a sleep study in 2016 09/2015 PSG> CPAP 11cmH20; Total AHI (/hr):62.4, O2 sat nadir 68%  HPI Chief Complaint  Patient presents with  . Follow-up    pt states he is doing well- tolerating cpap mask well, no complaints.    CPAP working well  He has been sleeping 7.5-8 hours.  No headaches, not feeling sleepy during the daytime.  Driving OK, not feeling sleepy when he is driving.  Energy level is up using it.  No daytime fatigue.    He has some sinus congestion when he uses his CPAP machine.  Past Medical History:  Diagnosis Date  . Diabetes mellitus    NIDDM  . Hyperlipidemia   . Kidney stones   . Seasonal allergies   . Sleep apnea sleep study 12/08/2007   no CPAP use; uses a mouth guard at night  . Umbilical hernia       Review of Systems     Objective:   Physical Exam Vitals:   06/11/16 1440  BP: 128/74  Pulse: (!) 106  SpO2: 96%  Weight: (!) 320 lb (145.2 kg)  Height: 6\' 2"  (1.88 m)    Gen: obese but well appearing HENT: OP clear, TM's clear, neck supple PULM: CTA B, normal percussion CV: RRR, no mgr, trace edema GI: BS+, soft, nontender Derm: no cyanosis or rash Psyche: normal mood and affect       Assessment & Plan:  Obstructive sleep apnea This is been a stable interval for him and he remains compliant with his CPAP therapy. He has been experiencing some sinus symptoms which can happen with CPAP.  Plan: Saline gel Continue CPAP at night Request download He was counseled to avoid alcohol and caffeine He is counseled to lose weight He was advised not to drive off feeling sleepy Follow-up one year    Current Outpatient Prescriptions:  .  aspirin EC 81 MG tablet, Take 81 mg by mouth daily. , Disp: , Rfl:  .  atorvastatin (LIPITOR) 20 MG tablet, Take 20 mg by mouth daily., Disp: , Rfl:  .  glimepiride  (AMARYL) 1 MG tablet, Take 1 mg by mouth daily with breakfast., Disp: , Rfl:  .  ibuprofen (ADVIL,MOTRIN) 600 MG tablet, Take 600 mg by mouth as needed. , Disp: , Rfl: 0 .  lisinopril (PRINIVIL,ZESTRIL) 20 MG tablet, Take 20 mg by mouth daily., Disp: , Rfl:  .  magnesium oxide (MAG-OX) 400 MG tablet, Take 400 mg by mouth daily., Disp: , Rfl:  .  metFORMIN (GLUCOPHAGE-XR) 500 MG 24 hr tablet, Take 2,000 mg by mouth daily with breakfast. , Disp: , Rfl:  Dictation #1 [id number]

## 2016-06-12 DIAGNOSIS — G5602 Carpal tunnel syndrome, left upper limb: Secondary | ICD-10-CM | POA: Diagnosis not present

## 2016-08-30 DIAGNOSIS — E119 Type 2 diabetes mellitus without complications: Secondary | ICD-10-CM | POA: Diagnosis not present

## 2016-08-30 DIAGNOSIS — E785 Hyperlipidemia, unspecified: Secondary | ICD-10-CM | POA: Diagnosis not present

## 2016-08-30 DIAGNOSIS — Z79899 Other long term (current) drug therapy: Secondary | ICD-10-CM | POA: Diagnosis not present

## 2016-09-04 DIAGNOSIS — Z23 Encounter for immunization: Secondary | ICD-10-CM | POA: Diagnosis not present

## 2016-09-04 DIAGNOSIS — E785 Hyperlipidemia, unspecified: Secondary | ICD-10-CM | POA: Diagnosis not present

## 2016-09-04 DIAGNOSIS — I10 Essential (primary) hypertension: Secondary | ICD-10-CM | POA: Diagnosis not present

## 2016-09-04 DIAGNOSIS — E1129 Type 2 diabetes mellitus with other diabetic kidney complication: Secondary | ICD-10-CM | POA: Diagnosis not present

## 2016-09-25 ENCOUNTER — Encounter: Payer: Self-pay | Admitting: Gastroenterology

## 2016-10-03 ENCOUNTER — Encounter (INDEPENDENT_AMBULATORY_CARE_PROVIDER_SITE_OTHER): Payer: Self-pay | Admitting: *Deleted

## 2016-10-17 ENCOUNTER — Other Ambulatory Visit (INDEPENDENT_AMBULATORY_CARE_PROVIDER_SITE_OTHER): Payer: Self-pay | Admitting: *Deleted

## 2016-10-17 DIAGNOSIS — Z85038 Personal history of other malignant neoplasm of large intestine: Secondary | ICD-10-CM

## 2016-10-17 DIAGNOSIS — Z800 Family history of malignant neoplasm of digestive organs: Secondary | ICD-10-CM | POA: Insufficient documentation

## 2016-11-22 ENCOUNTER — Telehealth (INDEPENDENT_AMBULATORY_CARE_PROVIDER_SITE_OTHER): Payer: Self-pay | Admitting: *Deleted

## 2016-11-22 ENCOUNTER — Encounter (INDEPENDENT_AMBULATORY_CARE_PROVIDER_SITE_OTHER): Payer: Self-pay | Admitting: *Deleted

## 2016-11-22 MED ORDER — PEG 3350-KCL-NA BICARB-NACL 420 G PO SOLR: 4000.0000 mL | Freq: Once | ORAL | 0 refills | Status: AC

## 2016-11-22 NOTE — Telephone Encounter (Signed)
Patient needs trilyte 

## 2016-11-30 DIAGNOSIS — E119 Type 2 diabetes mellitus without complications: Secondary | ICD-10-CM | POA: Diagnosis not present

## 2016-12-07 ENCOUNTER — Telehealth (INDEPENDENT_AMBULATORY_CARE_PROVIDER_SITE_OTHER): Payer: Self-pay | Admitting: *Deleted

## 2016-12-07 DIAGNOSIS — Z6841 Body Mass Index (BMI) 40.0 and over, adult: Secondary | ICD-10-CM | POA: Diagnosis not present

## 2016-12-07 DIAGNOSIS — E1129 Type 2 diabetes mellitus with other diabetic kidney complication: Secondary | ICD-10-CM | POA: Diagnosis not present

## 2016-12-07 DIAGNOSIS — I10 Essential (primary) hypertension: Secondary | ICD-10-CM | POA: Diagnosis not present

## 2016-12-07 NOTE — Telephone Encounter (Signed)
Referring MD/PCP: fagan   Procedure: tcs  Reason/Indication:  Hx polyps, fa, hx colon ca  Has patient had this procedure before?  Yes, 2012  If so, when, by whom and where?    Is there a family history of colon cancer?  Yes, mother  Who?  What age when diagnosed?    Is patient diabetic?   yes      Does patient have prosthetic heart valve or mechanical valve?  no  Do you have a pacemaker?  no  Has patient ever had endocarditis? no  Has patient had joint replacement within last 12 months?  no  Does patient tend to be constipated or take laxatives? no  Does patient have a history of alcohol/drug use?  no  Is patient on Coumadin, Plavix and/or Aspirin? yes  Medications: asa 81 mg daily, metformin 500 mg 4 tab in am, lisinopril 20 mg daily, glimepiride 1 mg daily, magnesium oxide daily, victoza sliding scale  Allergies: nkda  Medication Adjustment: asa 2 days, hold DM meds pm & am  Procedure date & time: 01/02/17 at 1030

## 2016-12-10 NOTE — Telephone Encounter (Signed)
agree

## 2017-01-01 ENCOUNTER — Encounter (INDEPENDENT_AMBULATORY_CARE_PROVIDER_SITE_OTHER): Payer: Self-pay | Admitting: *Deleted

## 2017-01-09 DIAGNOSIS — L570 Actinic keratosis: Secondary | ICD-10-CM | POA: Diagnosis not present

## 2017-01-09 DIAGNOSIS — D225 Melanocytic nevi of trunk: Secondary | ICD-10-CM | POA: Diagnosis not present

## 2017-01-09 DIAGNOSIS — X32XXXA Exposure to sunlight, initial encounter: Secondary | ICD-10-CM | POA: Diagnosis not present

## 2017-01-09 DIAGNOSIS — C44219 Basal cell carcinoma of skin of left ear and external auricular canal: Secondary | ICD-10-CM | POA: Diagnosis not present

## 2017-01-09 DIAGNOSIS — L818 Other specified disorders of pigmentation: Secondary | ICD-10-CM | POA: Diagnosis not present

## 2017-01-09 DIAGNOSIS — L814 Other melanin hyperpigmentation: Secondary | ICD-10-CM | POA: Diagnosis not present

## 2017-01-09 DIAGNOSIS — L821 Other seborrheic keratosis: Secondary | ICD-10-CM | POA: Diagnosis not present

## 2017-01-30 ENCOUNTER — Encounter (INDEPENDENT_AMBULATORY_CARE_PROVIDER_SITE_OTHER): Payer: Self-pay | Admitting: Internal Medicine

## 2017-02-20 ENCOUNTER — Ambulatory Visit (INDEPENDENT_AMBULATORY_CARE_PROVIDER_SITE_OTHER): Payer: Medicare Other | Admitting: Internal Medicine

## 2017-02-20 ENCOUNTER — Encounter (INDEPENDENT_AMBULATORY_CARE_PROVIDER_SITE_OTHER): Payer: Self-pay | Admitting: Internal Medicine

## 2017-02-21 DIAGNOSIS — H25813 Combined forms of age-related cataract, bilateral: Secondary | ICD-10-CM | POA: Diagnosis not present

## 2017-02-21 DIAGNOSIS — E119 Type 2 diabetes mellitus without complications: Secondary | ICD-10-CM | POA: Diagnosis not present

## 2017-03-20 ENCOUNTER — Encounter (HOSPITAL_COMMUNITY)
Admission: RE | Disposition: A | Discharge status: Home / Self Care | Payer: Self-pay | Source: Ambulatory Visit | Attending: Internal Medicine

## 2017-03-20 ENCOUNTER — Encounter (HOSPITAL_COMMUNITY): Payer: Self-pay | Admitting: *Deleted

## 2017-03-20 ENCOUNTER — Ambulatory Visit (HOSPITAL_COMMUNITY)
Admission: RE | Admit: 2017-03-20 | Discharge: 2017-03-20 | Disposition: A | Discharge status: Home / Self Care | Payer: Medicare Other | Source: Ambulatory Visit | Attending: Internal Medicine | Admitting: Internal Medicine

## 2017-03-20 DIAGNOSIS — Z7984 Long term (current) use of oral hypoglycemic drugs: Secondary | ICD-10-CM | POA: Insufficient documentation

## 2017-03-20 DIAGNOSIS — Z85038 Personal history of other malignant neoplasm of large intestine: Secondary | ICD-10-CM | POA: Insufficient documentation

## 2017-03-20 DIAGNOSIS — K5730 Diverticulosis of large intestine without perforation or abscess without bleeding: Secondary | ICD-10-CM | POA: Insufficient documentation

## 2017-03-20 DIAGNOSIS — Z7982 Long term (current) use of aspirin: Secondary | ICD-10-CM | POA: Diagnosis not present

## 2017-03-20 DIAGNOSIS — Z09 Encounter for follow-up examination after completed treatment for conditions other than malignant neoplasm: Secondary | ICD-10-CM

## 2017-03-20 DIAGNOSIS — G4730 Sleep apnea, unspecified: Secondary | ICD-10-CM | POA: Insufficient documentation

## 2017-03-20 DIAGNOSIS — Z800 Family history of malignant neoplasm of digestive organs: Secondary | ICD-10-CM

## 2017-03-20 DIAGNOSIS — Z1211 Encounter for screening for malignant neoplasm of colon: Secondary | ICD-10-CM | POA: Insufficient documentation

## 2017-03-20 DIAGNOSIS — E785 Hyperlipidemia, unspecified: Secondary | ICD-10-CM | POA: Diagnosis not present

## 2017-03-20 DIAGNOSIS — K6289 Other specified diseases of anus and rectum: Secondary | ICD-10-CM | POA: Insufficient documentation

## 2017-03-20 DIAGNOSIS — Z79899 Other long term (current) drug therapy: Secondary | ICD-10-CM | POA: Insufficient documentation

## 2017-03-20 DIAGNOSIS — Z87442 Personal history of urinary calculi: Secondary | ICD-10-CM | POA: Diagnosis not present

## 2017-03-20 DIAGNOSIS — Z86010 Personal history of colonic polyps: Secondary | ICD-10-CM | POA: Insufficient documentation

## 2017-03-20 DIAGNOSIS — E119 Type 2 diabetes mellitus without complications: Secondary | ICD-10-CM | POA: Insufficient documentation

## 2017-03-20 DIAGNOSIS — K562 Volvulus: Secondary | ICD-10-CM | POA: Diagnosis not present

## 2017-03-20 HISTORY — PX: COLONOSCOPY: SHX5424

## 2017-03-20 LAB — GLUCOSE, CAPILLARY: GLUCOSE-CAPILLARY: 137 mg/dL — AB (ref 65–99)

## 2017-03-20 SURGERY — COLONOSCOPY
Anesthesia: Moderate Sedation

## 2017-03-20 MED ORDER — MEPERIDINE HCL 50 MG/ML IJ SOLN
INTRAMUSCULAR | Status: DC | PRN
  Administered 2017-03-20 (×2): 25 mg via INTRAVENOUS

## 2017-03-20 MED ORDER — MIDAZOLAM HCL 5 MG/5ML IJ SOLN
INTRAMUSCULAR | Status: DC | PRN
  Administered 2017-03-20: 2 mg via INTRAVENOUS
  Administered 2017-03-20: 3 mg via INTRAVENOUS
  Administered 2017-03-20: 2 mg via INTRAVENOUS

## 2017-03-20 MED ORDER — MIDAZOLAM HCL 5 MG/5ML IJ SOLN
INTRAMUSCULAR | Status: AC
  Filled 2017-03-20: qty 10

## 2017-03-20 MED ORDER — MEPERIDINE HCL 50 MG/ML IJ SOLN
INTRAMUSCULAR | Status: AC
  Filled 2017-03-20: qty 1

## 2017-03-20 MED ORDER — SODIUM CHLORIDE 0.9 % IV SOLN
INTRAVENOUS | Status: DC
  Administered 2017-03-20: 1000 mL via INTRAVENOUS

## 2017-03-20 NOTE — H&P (Signed)
Micheal [name]. is an 66 y.o. male.   Chief Complaint: Patient is here for colonoscopy. HPI: Patient is 60 six-year-old Caucasian male who was history of colonic adenomas and is here for surveillance colonoscopy. He denies abdominal pain change in bowel habits or rectal bleeding. Family history significant for CRC in mother who was in her 70s when she died.  Past Medical History:  Diagnosis Date  . Diabetes mellitus    NIDDM  . Hyperlipidemia   . Kidney stones   . Seasonal allergies   . Sleep apnea sleep study 12/08/2007   no CPAP use; uses a mouth guard at night  . Umbilical hernia     Past Surgical History:  Procedure Laterality Date  . COLONOSCOPY  10/28/2006; 10/23/2011  . COLONOSCOPY  10/23/2011   SLF:  1). Internal hemorroids, small  2). POLYPOID LESION (AC) . next TCS 10/2016  . CYSTOSCOPY W/ URETERAL STENT PLACEMENT  04/12/2011   right retrograde; basketing of stone  . ESOPHAGOGASTRODUODENOSCOPY N/A 10/12/2014   RMR:  abnormal esophagus as described above query EOE status post biopsy; food impaction passes spontaneously with intubation of the esophagus c/w EOE, probable noncritical stricture at GEJ with possible ring component. food bolus had passed.   . ESOPHAGOGASTRODUODENOSCOPY N/A 11/16/2014   Procedure: ESOPHAGOGASTRODUODENOSCOPY (EGD);  Surgeon: Daneil Dolin, MD;  Location: [facility];  Service: Endoscopy;  Laterality: N/A;  0830  . [place] N/A 11/16/2014   Procedure: Venia Minks DILATION;  Surgeon: Daneil Dolin, MD;  Location: [facility];  Service: Endoscopy;  Laterality: N/A;  . SAVORY DILATION N/A 11/16/2014   Procedure: SAVORY DILATION;  Surgeon: Daneil Dolin, MD;  Location: [facility];  Service: Endoscopy;  Laterality: N/A;  . UMBILICAL HERNIA REPAIR  10/26/2011   Procedure: HERNIA REPAIR UMBILICAL ADULT;  Surgeon: Rolm Bookbinder, MD;  Location: [facility];  Service: General;  Laterality: N/A;  umbilical  hernia repair with mesh    Family History  Problem Relation Age of Onset  . Cancer Mother     colon, age 92 deceased  . Stroke Father   . Diabetes Brother    Social History:  reports that he has never smoked. He has never used smokeless tobacco. He reports that he drinks alcohol. He reports that he does not use drugs.  Allergies: No Known Allergies  Medications Prior to Admission  Medication Sig Dispense Refill  . aspirin EC 81 MG tablet Take 81 mg by mouth daily.     Marland Kitchen atorvastatin (LIPITOR) 20 MG tablet Take 20 mg by mouth daily.    Marland Kitchen glimepiride (AMARYL) 1 MG tablet Take 1 mg by mouth daily with breakfast.    . lisinopril (PRINIVIL,ZESTRIL) 20 MG tablet Take 20 mg by mouth daily.    . magnesium oxide (MAG-OX) 400 MG tablet Take 400 mg by mouth daily.    . metFORMIN (GLUCOPHAGE-XR) 500 MG 24 hr tablet Take 2,000 mg by mouth daily with breakfast.     . VICTOZA 18 MG/3ML SOPN Inject 1.2 mg into the skin daily.      Results for orders placed or performed during the hospital encounter of 03/20/17 (from the past 48 hour(s))  Glucose, capillary     Status: Abnormal   Collection Time: 03/20/17 10:25 AM  Result Value Ref Range   Glucose-Capillary 137 (H) 65 - 99 mg/dL   No results found.  ROS  Blood pressure (!) 124/92, pulse 89, temperature  80 F (37.2 C), temperature source Oral, resp. rate 19, height 6\' 3"  (1.905 m), weight 280 lb (127 kg), SpO2 96 %. Physical Exam  Constitutional: He appears well-developed and well-nourished.  HENT:  Mouth/Throat: Oropharynx is clear and moist.  Eyes: Conjunctivae are normal. No scleral icterus.  Neck: No thyromegaly present.  Cardiovascular: Normal rate, regular rhythm and normal heart sounds.   No murmur heard. Respiratory: Effort normal and breath sounds normal.  GI:  Abdomen is full but soft and nontender without organomegaly or masses.  Musculoskeletal: He exhibits no edema.  Lymphadenopathy:    He has no cervical adenopathy.   Neurological: He is alert.  Skin: Skin is warm and dry.     Assessment/Plan History of colonic adenomas. Family history of CRC in first-degree relative. Surveillance colonoscopy.  Hildred Laser, MD 03/20/2017, 11:43 AM

## 2017-03-20 NOTE — Op Note (Signed)
[facility] Patient Name: Micheal [name] Procedure Date: 03/20/2017 11:20 AM MRN: [id number] Date of Birth: Apr 01, 1951 Attending MD: Hildred Laser , MD CSN: [id number] Age: 66 Admit Type: Outpatient Procedure:                Colonoscopy Indications:              High risk colon cancer surveillance: Personal                            history of colonic polyps, Family history of colon                            cancer in a first-degree relative Providers:                Hildred Laser, MD, Lurline Del, RN, Aram Candela Referring MD:             Asencion Noble, MD Medicines:                Meperidine 50 mg IV, Midazolam 7 mg IV Complications:            No immediate complications. Estimated Blood Loss:     Estimated blood loss: none. Procedure:                Pre-Anesthesia Assessment:                           - Prior to the procedure, a History and Physical                            was performed, and patient medications and                            allergies were reviewed. The patient's tolerance of                            previous anesthesia was also reviewed. The risks                            and benefits of the procedure and the sedation                            options and risks were discussed with the patient.                            All questions were answered, and informed consent                            was obtained. Prior Anticoagulants: The patient                            last took aspirin 2 days prior to the procedure.                            ASA Grade Assessment: II - A patient with mild  systemic disease. After reviewing the risks and                            benefits, the patient was deemed in satisfactory                            condition to undergo the procedure.                           After obtaining informed consent, the colonoscope                            was passed under direct vision. Throughout the                     procedure, the patient's blood pressure, pulse, and                            oxygen saturations were monitored continuously. The                            EC-[id number] ([id number]) scope was introduced through                            the anus and advanced to the the cecum, identified                            by appendiceal orifice and ileocecal valve. The                            colonoscopy was somewhat difficult due to a                            tortuous colon. Successful completion of the                            procedure was aided by using manual pressure and                            withdrawing and reinserting the scope. The patient                            tolerated the procedure well. The quality of the                            bowel preparation was adequate. The ileocecal                            valve, appendiceal orifice, and rectum were                            photographed. Scope In: 11:54:59 AM Scope Out: 12:14:26 PM Scope Withdrawal Time: 0 hours 8 minutes 17 seconds  Total Procedure Duration: 0 hours 19 minutes 27 seconds  Findings:  The perianal and digital rectal examinations were normal.      A few medium-mouthed diverticula were found in the sigmoid colon.      The exam was otherwise without abnormality.      Anal papilla(e) were hypertrophied. Impression:               - Diverticulosis in the sigmoid colon.                           - The examination was otherwise normal.                           - Anal papilla(e) were hypertrophied.                           - No specimens collected. Moderate Sedation:      Moderate (conscious) sedation was administered by the endoscopy nurse       and supervised by the endoscopist. The following parameters were       monitored: oxygen saturation, heart rate, blood pressure, CO2       capnography and response to care. Total physician intraservice time was       27 minutes. Recommendation:            - Patient has a contact number available for                            emergencies. The signs and symptoms of potential                            delayed complications were discussed with the                            patient. Return to normal activities tomorrow.                            Written discharge instructions were provided to the                            patient.                           - High fiber diet today.                           - Continue present medications.                           - Repeat colonoscopy in 5 years for surveillance. Procedure Code(s):        --- Professional ---                           [phone], Colonoscopy, flexible; diagnostic, including                            collection of specimen(s) by brushing or washing,  when performed (separate procedure)                           99152, Moderate sedation services provided by the                            same physician or other qualified health care                            professional performing the diagnostic or                            therapeutic service that the sedation supports,                            requiring the presence of an independent trained                            observer to assist in the monitoring of the                            patient's level of consciousness and physiological                            status; initial 15 minutes of intraservice time,                            patient age 34 years or older                           [phone], Moderate sedation services; each additional                            15 minutes intraservice time Diagnosis Code(s):        --- Professional ---                           Z86.010, Personal history of colonic polyps                           K62.89, Other specified diseases of anus and rectum                           Z80.0, Family history of malignant neoplasm of                             digestive organs                           K57.30, Diverticulosis of large intestine without                            perforation or abscess without bleeding CPT copyright 2016 [employer]. All rights reserved. The codes documented in this report are preliminary and upon coder review may  be revised to meet  current compliance requirements. Hildred Laser, MD Hildred Laser, MD 03/20/2017 12:22:09 PM This report has been signed electronically. Number of Addenda: 0

## 2017-03-20 NOTE — Discharge Instructions (Signed)
Resume usual medications and high fiber diet. No driving for 24 hours. Next colonoscopy in 5 years.   Colonoscopy, Adult, Care After This sheet gives you information about how to care for yourself after your procedure. Your doctor may also give you more specific instructions. If you have problems or questions, call your doctor. Follow these instructions at home: General instructions    For the first 24 hours after the procedure:  Do not drive or use machinery.  Do not sign important documents.  Do not drink alcohol.  Do your daily activities more slowly than normal.  Eat foods that are soft and easy to digest.  Rest often.  Take over-the-counter or prescription medicines only as told by your doctor.  It is up to you to get the results of your procedure. Ask your doctor, or the department performing the procedure, when your results will be ready. To help cramping and bloating:   Try walking around.  Put heat on your belly (abdomen) as told by your doctor. Use a heat source that your doctor recommends, such as a moist heat pack or a heating pad.  Put a towel between your skin and the heat source.  Leave the heat on for 20-30 minutes.  Remove the heat if your skin turns bright red. This is especially important if you cannot feel pain, heat, or cold. You can get burned. Eating and drinking   Drink enough fluid to keep your pee (urine) clear or pale yellow.  Return to your normal diet as told by your doctor. Avoid heavy or fried foods that are hard to digest.  Avoid drinking alcohol for as long as told by your doctor. Contact a doctor if:  You have blood in your poop (stool) 2-3 days after the procedure. Get help right away if:  You have more than a small amount of blood in your poop.  You see large clumps of tissue (blood clots) in your poop.  Your belly is swollen.  You feel sick to your stomach (nauseous).  You throw up (vomit).  You have a fever.  You  have belly pain that gets worse, and medicine does not help your pain. This information is not intended to replace advice given to you by your health care provider. Make sure you discuss any questions you have with your health care provider. Document Released: 12/08/2010 Document Revised: 07/30/2016 Document Reviewed: 07/30/2016 Elsevier Interactive Patient Education  2017 [employer]  Diverticulosis Diverticulosis is a condition that develops when small pouches (diverticula) form in the wall of the large intestine (colon). The colon is where water is absorbed and stool is formed. The pouches form when the inside layer of the colon pushes through weak spots in the outer layers of the colon. You may have a few pouches or many of them. What are the causes? The cause of this condition is not known. What increases the risk? The following factors may make you more likely to develop this condition:  Being older than age 49. Your risk for this condition increases with age. Diverticulosis is rare among people younger than age 32. By age 77, many people have it.  Eating a low-fiber diet.  Having frequent constipation.  Being overweight.  Not getting enough exercise.  Smoking.  Taking over-the-counter pain medicines, like aspirin and ibuprofen.  Having a family history of diverticulosis. What are the signs or symptoms? In most people, there are no symptoms of this condition. If you do have symptoms, they may include:  Bloating.  Cramps in the abdomen.  Constipation or diarrhea.  Pain in the lower left side of the abdomen. How is this diagnosed? This condition is most often diagnosed during an exam for other colon problems. Because diverticulosis usually has no symptoms, it often cannot be diagnosed independently. This condition may be diagnosed by:  Using a flexible scope to examine the colon (colonoscopy).  Taking an X-ray of the colon after dye has been put into the colon (barium  enema).  Doing a CT scan. How is this treated? You may not need treatment for this condition if you have never developed an infection related to diverticulosis. If you have had an infection before, treatment may include:  Eating a high-fiber diet. This may include eating more fruits, vegetables, and grains.  Taking a fiber supplement.  Taking a live bacteria supplement (probiotic).  Taking medicine to relax your colon.  Taking antibiotic medicines. Follow these instructions at home:  Drink 6-8 glasses of water or more each day to prevent constipation.  Try not to strain when you have a bowel movement.  If you have had an infection before:  Eat more fiber as directed by your health care provider or your diet and nutrition specialist (dietitian).  Take a fiber supplement or probiotic, if your health care provider approves.  Take over-the-counter and prescription medicines only as told by your health care provider.  If you were prescribed an antibiotic, take it as told by your health care provider. Do not stop taking the antibiotic even if you start to feel better.  Keep all follow-up visits as told by your health care provider. This is important. Contact a health care provider if:  You have pain in your abdomen.  You have bloating.  You have cramps.  You have not had a bowel movement in 3 days. Get help right away if:  Your pain gets worse.  Your bloating becomes very bad.  You have a fever or chills, and your symptoms suddenly get worse.  You vomit.  You have bowel movements that are bloody or black.  You have bleeding from your rectum. Summary  Diverticulosis is a condition that develops when small pouches (diverticula) form in the wall of the large intestine (colon).  You may have a few pouches or many of them.  This condition is most often diagnosed during an exam for other colon problems.  If you have had an infection related to diverticulosis,  treatment may include increasing the fiber in your diet, taking supplements, or taking medicines. This information is not intended to replace advice given to you by your health care provider. Make sure you discuss any questions you have with your health care provider. Document Released: 08/02/2004 Document Revised: 09/24/2016 Document Reviewed: 09/24/2016 Elsevier Interactive Patient Education  2017 [employer].

## 2017-03-21 ENCOUNTER — Encounter (HOSPITAL_COMMUNITY): Payer: Self-pay | Admitting: Internal Medicine

## 2017-05-21 DIAGNOSIS — N529 Male erectile dysfunction, unspecified: Secondary | ICD-10-CM | POA: Diagnosis not present

## 2017-05-21 DIAGNOSIS — Z79899 Other long term (current) drug therapy: Secondary | ICD-10-CM | POA: Diagnosis not present

## 2017-05-21 DIAGNOSIS — E119 Type 2 diabetes mellitus without complications: Secondary | ICD-10-CM | POA: Diagnosis not present

## 2017-05-21 DIAGNOSIS — E6609 Other obesity due to excess calories: Secondary | ICD-10-CM | POA: Diagnosis not present

## 2017-05-21 DIAGNOSIS — E785 Hyperlipidemia, unspecified: Secondary | ICD-10-CM | POA: Diagnosis not present

## 2017-05-21 DIAGNOSIS — Z125 Encounter for screening for malignant neoplasm of prostate: Secondary | ICD-10-CM | POA: Diagnosis not present

## 2017-05-21 DIAGNOSIS — I10 Essential (primary) hypertension: Secondary | ICD-10-CM | POA: Diagnosis not present

## 2017-05-23 DIAGNOSIS — E1122 Type 2 diabetes mellitus with diabetic chronic kidney disease: Secondary | ICD-10-CM | POA: Diagnosis not present

## 2017-05-23 DIAGNOSIS — I10 Essential (primary) hypertension: Secondary | ICD-10-CM | POA: Diagnosis not present

## 2017-05-23 DIAGNOSIS — Z23 Encounter for immunization: Secondary | ICD-10-CM | POA: Diagnosis not present

## 2017-05-23 DIAGNOSIS — G4733 Obstructive sleep apnea (adult) (pediatric): Secondary | ICD-10-CM | POA: Diagnosis not present

## 2017-05-23 DIAGNOSIS — Z6841 Body Mass Index (BMI) 40.0 and over, adult: Secondary | ICD-10-CM | POA: Diagnosis not present

## 2017-06-18 ENCOUNTER — Encounter: Payer: Self-pay | Admitting: Pulmonary Disease

## 2017-06-18 ENCOUNTER — Ambulatory Visit (INDEPENDENT_AMBULATORY_CARE_PROVIDER_SITE_OTHER): Payer: Medicare Other | Admitting: Pulmonary Disease

## 2017-06-18 VITALS — BP 128/74 | HR 101 | Ht 74.0 in | Wt 294.6 lb

## 2017-06-18 DIAGNOSIS — G4733 Obstructive sleep apnea (adult) (pediatric): Secondary | ICD-10-CM

## 2017-06-18 NOTE — Patient Instructions (Addendum)
Keep using your CPAP machine as you are doing We will request a download for a compliance report No driving while sleepy We will see you back in one year or sooner if needed

## 2017-06-18 NOTE — Progress Notes (Signed)
   Subjective:    Patient ID: Micheal [name]., male    DOB: 05-30-1951, 66 y.o.   MRN: [id number]  Synopsis: Micheal [name] has OSA diagnosed with a sleep study in 2016 09/2015 PSG> CPAP 11cmH20; Total AHI (/hr):62.4, O2 sat nadir 68%  HPI Chief Complaint  Patient presents with  . Follow-up    annual rov.  pt states he is doing well, no complaints with CPAP.  download printed.     He has been doing well since the last visit. No trouble with sleepiness. No trouble driving. No falling asleep. Still using CPAP every night and he says it makes him feel great. He is washing machine regularly with water and vinegar.  Past Medical History:  Diagnosis Date  . Diabetes mellitus    NIDDM  . Hyperlipidemia   . Kidney stones   . Seasonal allergies   . Sleep apnea sleep study 12/08/2007   no CPAP use; uses a mouth guard at night  . Umbilical hernia       Review of Systems     Objective:   Physical Exam Vitals:   06/18/17 1632  BP: 128/74  Pulse: (!) 101  SpO2: 100%  Weight: 294 lb 9.6 oz (133.6 kg)  Height: 6\' 2"  (1.88 m)    Gen: well appearing HENT: OP clear, TM's clear, neck supple PULM: CTA B, normal percussion CV: RRR, no mgr, trace edema GI: BS+, soft, nontender Derm: no cyanosis or rash Psyche: normal mood and affect        Assessment & Plan:  Obstructive sleep apnea   Discussion: This is been a stable interval for Micheal [name]. He has not had worsening of his obstructive sleep apnea since the last visit. He is compliant with CPAP.   Plan: Keep using your CPAP machine as you are doing We will request a download for a compliance report No driving while sleepy We will see you back in one year or sooner if needed  Current Outpatient Prescriptions:  .  aspirin EC 81 MG tablet, Take 81 mg by mouth daily. , Disp: , Rfl:  .  atorvastatin (LIPITOR) 20 MG tablet, Take 20 mg by mouth daily., Disp: , Rfl:  .  glimepiride (AMARYL) 1 MG tablet, Take 1 mg by mouth daily  with breakfast., Disp: , Rfl:  .  lisinopril (PRINIVIL,ZESTRIL) 20 MG tablet, Take 20 mg by mouth daily., Disp: , Rfl:  .  magnesium oxide (MAG-OX) 400 MG tablet, Take 400 mg by mouth daily., Disp: , Rfl:  .  metFORMIN (GLUCOPHAGE-XR) 500 MG 24 hr tablet, Take 2,000 mg by mouth daily with breakfast. , Disp: , Rfl:  .  VICTOZA 18 MG/3ML SOPN, Inject 1.2 mg into the skin daily., DispJonah Blue  Dictation #1 [id number]  [id number]

## 2017-06-28 ENCOUNTER — Encounter (HOSPITAL_COMMUNITY): Payer: Self-pay | Admitting: Emergency Medicine

## 2017-06-28 ENCOUNTER — Emergency Department (HOSPITAL_COMMUNITY)
Admission: EM | Admit: 2017-06-28 | Discharge: 2017-06-29 | Disposition: A | Discharge status: Home / Self Care | Payer: Medicare Other | Attending: Emergency Medicine | Admitting: Emergency Medicine

## 2017-06-28 DIAGNOSIS — Z7984 Long term (current) use of oral hypoglycemic drugs: Secondary | ICD-10-CM | POA: Diagnosis not present

## 2017-06-28 DIAGNOSIS — Z7982 Long term (current) use of aspirin: Secondary | ICD-10-CM | POA: Diagnosis not present

## 2017-06-28 DIAGNOSIS — E119 Type 2 diabetes mellitus without complications: Secondary | ICD-10-CM | POA: Diagnosis not present

## 2017-06-28 DIAGNOSIS — N201 Calculus of ureter: Secondary | ICD-10-CM | POA: Insufficient documentation

## 2017-06-28 DIAGNOSIS — Z79899 Other long term (current) drug therapy: Secondary | ICD-10-CM | POA: Diagnosis not present

## 2017-06-28 DIAGNOSIS — K760 Fatty (change of) liver, not elsewhere classified: Secondary | ICD-10-CM | POA: Diagnosis not present

## 2017-06-28 DIAGNOSIS — R109 Unspecified abdominal pain: Secondary | ICD-10-CM | POA: Diagnosis present

## 2017-06-28 DIAGNOSIS — N132 Hydronephrosis with renal and ureteral calculous obstruction: Secondary | ICD-10-CM | POA: Diagnosis not present

## 2017-06-28 NOTE — ED Triage Notes (Signed)
Pt c/o left flank pain that radiates to the lower abd.

## 2017-06-29 ENCOUNTER — Emergency Department (HOSPITAL_COMMUNITY): Payer: Medicare Other

## 2017-06-29 DIAGNOSIS — K760 Fatty (change of) liver, not elsewhere classified: Secondary | ICD-10-CM | POA: Diagnosis not present

## 2017-06-29 DIAGNOSIS — N201 Calculus of ureter: Secondary | ICD-10-CM | POA: Diagnosis not present

## 2017-06-29 LAB — CBC WITH DIFFERENTIAL/PLATELET
BASOS ABS: 0 10*3/uL (ref 0.0–0.1)
BASOS PCT: 0 %
EOS ABS: 0.1 10*3/uL (ref 0.0–0.7)
Eosinophils Relative: 1 %
HCT: 42.9 % (ref 39.0–52.0)
HEMOGLOBIN: 14.4 g/dL (ref 13.0–17.0)
LYMPHS ABS: 2.2 10*3/uL (ref 0.7–4.0)
Lymphocytes Relative: 18 %
MCH: 29.8 pg (ref 26.0–34.0)
MCHC: 33.6 g/dL (ref 30.0–36.0)
MCV: 88.6 fL (ref 78.0–100.0)
Monocytes Absolute: 1.3 10*3/uL — ABNORMAL HIGH (ref 0.1–1.0)
Monocytes Relative: 11 %
NEUTROS PCT: 70 %
Neutro Abs: 8.4 10*3/uL — ABNORMAL HIGH (ref 1.7–7.7)
Platelets: 228 10*3/uL (ref 150–400)
RBC: 4.84 MIL/uL (ref 4.22–5.81)
RDW: 12.7 % (ref 11.5–15.5)
WBC: 12 10*3/uL — AB (ref 4.0–10.5)

## 2017-06-29 LAB — BASIC METABOLIC PANEL
ANION GAP: 10 (ref 5–15)
BUN: 15 mg/dL (ref 6–20)
CHLORIDE: 102 mmol/L (ref 101–111)
CO2: 28 mmol/L (ref 22–32)
Calcium: 10.6 mg/dL — ABNORMAL HIGH (ref 8.9–10.3)
Creatinine, Ser: 1.25 mg/dL — ABNORMAL HIGH (ref 0.61–1.24)
GFR calc non Af Amer: 58 mL/min — ABNORMAL LOW (ref >60)
Glucose, Bld: 186 mg/dL — ABNORMAL HIGH (ref 65–99)
POTASSIUM: 4.2 mmol/L (ref 3.5–5.1)
SODIUM: 140 mmol/L (ref 135–145)

## 2017-06-29 LAB — URINALYSIS, ROUTINE W REFLEX MICROSCOPIC
BILIRUBIN URINE: NEGATIVE
GLUCOSE, UA: NEGATIVE mg/dL
HGB URINE DIPSTICK: NEGATIVE
Ketones, ur: NEGATIVE mg/dL
Leukocytes, UA: NEGATIVE
Nitrite: NEGATIVE
PH: 7 (ref 5.0–8.0)
Protein, ur: NEGATIVE mg/dL
SPECIFIC GRAVITY, URINE: 1.013 (ref 1.005–1.030)

## 2017-06-29 MED ORDER — SODIUM CHLORIDE 0.9 % IV BOLUS (SEPSIS)
1000.0000 mL | Freq: Once | INTRAVENOUS | Status: AC
  Administered 2017-06-29: 1000 mL via INTRAVENOUS

## 2017-06-29 MED ORDER — IBUPROFEN 800 MG PO TABS: 800.0000 mg | ORAL_TABLET | Freq: Three times a day (TID) | ORAL | 0 refills | Status: DC

## 2017-06-29 MED ORDER — ONDANSETRON HCL 4 MG/2ML IJ SOLN
4.0000 mg | Freq: Once | INTRAMUSCULAR | Status: AC
  Administered 2017-06-29: 4 mg via INTRAVENOUS
  Filled 2017-06-29: qty 2

## 2017-06-29 MED ORDER — TAMSULOSIN HCL 0.4 MG PO CAPS
0.4000 mg | ORAL_CAPSULE | Freq: Every day | ORAL | 0 refills | Status: DC
Start: 2017-06-29 — End: 2020-06-23

## 2017-06-29 MED ORDER — KETOROLAC TROMETHAMINE 30 MG/ML IJ SOLN
30.0000 mg | Freq: Once | INTRAMUSCULAR | Status: AC
  Administered 2017-06-29: 30 mg via INTRAVENOUS
  Filled 2017-06-29: qty 1

## 2017-06-29 MED ORDER — ONDANSETRON 4 MG PO TBDP: 4.0000 mg | ORAL_TABLET | Freq: Three times a day (TID) | ORAL | 0 refills | Status: DC | PRN

## 2017-06-29 MED ORDER — MORPHINE SULFATE (PF) 4 MG/ML IV SOLN
4.0000 mg | Freq: Once | INTRAVENOUS | Status: AC
  Administered 2017-06-29: 4 mg via INTRAVENOUS
  Filled 2017-06-29: qty 1

## 2017-06-29 MED ORDER — OXYCODONE-ACETAMINOPHEN 5-325 MG PO TABS: 1.0000 | ORAL_TABLET | ORAL | 0 refills | Status: DC | PRN

## 2017-06-29 MED ORDER — OXYCODONE-ACETAMINOPHEN 5-325 MG PO TABS
2.0000 | ORAL_TABLET | Freq: Once | ORAL | Status: AC
  Administered 2017-06-29: 2 via ORAL
  Filled 2017-06-29: qty 2

## 2017-06-29 NOTE — ED Provider Notes (Signed)
[place] [facility] DEPT Provider Note   CSN: [id number] Arrival date & time: 06/28/17  2341     History   Chief Complaint Chief Complaint  Patient presents with  . Flank Pain    HPI Micheal [name]. is a 66 y.o. male.  Patient presents with left-sided flank pain and abdominal pain has been intermittent for the past 4 days. The pain comes and goes lasting several hours at a time. This persisted tonight and radiates from his left back to his left lower abdomen. He did not take anything for it at home. He is concerned this could be another kidney stone. Denies any dysuria hematuria. Has had nausea but no vomiting. No fever. No diarrhea. No chest pain or shortness of breath. No testicular pain. No previous abdominal surgeries other than umbilical hernia.   The history is provided by the patient.  Flank Pain  Associated symptoms include abdominal pain. Pertinent negatives include no chest pain, no headaches and no shortness of breath.    Past Medical History:  Diagnosis Date  . Diabetes mellitus    NIDDM  . Hyperlipidemia   . Kidney stones   . Seasonal allergies   . Sleep apnea sleep study 12/08/2007   no CPAP use; uses a mouth guard at night  . Umbilical hernia     Patient Active Problem List   Diagnosis Date Noted  . Family history of colon cancer 10/17/2016  . History of colon cancer 10/17/2016  . Obesity 02/21/2016  . Hiatal hernia   . Esophageal stricture 11/04/2014  . Eosinophilic esophagitis 84/16/6063  . Colon adenomas 10/16/2011  . FH: colon cancer 10/16/2011  . Irreducible umbilical hernia 01/60/1093  . Obstructive sleep apnea 02/11/2008    Past Surgical History:  Procedure Laterality Date  . COLONOSCOPY  10/28/2006; 10/23/2011  . COLONOSCOPY  10/23/2011   SLF:  1). Internal hemorroids, small  2). POLYPOID LESION (AC) . next TCS 10/2016  . COLONOSCOPY N/A 03/20/2017   Procedure: COLONOSCOPY;  Surgeon: Rogene Houston, MD;  [facility];  Service:  Endoscopy;  Laterality: N/A;  1030-rescheduled 03/20/17 per Lelon Frohlich - pt to arrive at 10:00  . CYSTOSCOPY W/ URETERAL STENT PLACEMENT  04/12/2011   right retrograde; basketing of stone  . ESOPHAGOGASTRODUODENOSCOPY N/A 10/12/2014   RMR:  abnormal esophagus as described above query EOE status post biopsy; food impaction passes spontaneously with intubation of the esophagus c/w EOE, probable noncritical stricture at [facility] with possible ring component. food bolus had passed.   . ESOPHAGOGASTRODUODENOSCOPY N/A 11/16/2014   Procedure: ESOPHAGOGASTRODUODENOSCOPY (EGD);  Surgeon: Daneil Dolin, MD;  [facility];  Service: Endoscopy;  Laterality: N/A;  0830  . [place] N/A 11/16/2014   Procedure: Venia Minks DILATION;  Surgeon: Daneil Dolin, MD;  [facility];  Service: Endoscopy;  Laterality: N/A;  . SAVORY DILATION N/A 11/16/2014   Procedure: SAVORY DILATION;  Surgeon: Daneil Dolin, MD;  [facility];  Service: Endoscopy;  Laterality: N/A;  . UMBILICAL HERNIA REPAIR  10/26/2011   Procedure: HERNIA REPAIR UMBILICAL ADULT;  Surgeon: Rolm Bookbinder, MD;  Location: [place];  Service: General;  Laterality: N/A;  umbilical hernia repair with mesh       Home Medications    Prior to Admission medications   Medication Sig Start Date End Date Taking? Authorizing Provider  aspirin EC 81 MG tablet Take 81 mg by mouth daily.  [provider]  atorvastatin (LIPITOR) 20 MG tablet Take 20 mg by mouth daily.    [provider]  glimepiride (AMARYL) 1 MG tablet Take 1 mg by mouth daily with breakfast.    [provider]  lisinopril (PRINIVIL,ZESTRIL) 20 MG tablet Take 20 mg by mouth daily. 09/27/14   [provider]  magnesium oxide (MAG-OX) 400 MG tablet Take 400 mg by mouth daily.    [provider]  metFORMIN (GLUCOPHAGE-XR) 500 MG 24 hr tablet Take 2,000 mg by mouth daily with breakfast.   09/23/11   [provider]  VICTOZA 18 MG/3ML SOPN Inject 1.2 mg into the skin daily. 10/09/16   [provider]    Family History Family History  Problem Relation Age of Onset  . Cancer Mother        colon, age 92 deceased  . Stroke Father   . Diabetes Brother     Social History Social History  Substance Use Topics  . Smoking status: Never Smoker  . Smokeless tobacco: Never Used  . Alcohol use 0.0 oz/week    1 - 3 Cans of beer per week     Comment: occasionally     Allergies   Patient has no known allergies.   Review of Systems Review of Systems  Constitutional: Negative for activity change, appetite change and fever.  HENT: Negative for congestion and postnasal drip.   Respiratory: Negative for cough, chest tightness and shortness of breath.   Cardiovascular: Negative for chest pain.  Gastrointestinal: Positive for abdominal pain and nausea. Negative for vomiting.  Genitourinary: Positive for difficulty urinating and flank pain. Negative for dysuria, scrotal swelling, testicular pain and urgency.  Musculoskeletal: Positive for back pain. Negative for arthralgias and myalgias.  Skin: Negative for rash.  Neurological: Negative for dizziness, weakness and headaches.     all other systems are negative except as noted in the HPI and PMH.   Physical Exam Updated Vital Signs BP (!) 166/105   Pulse 86   Temp 98.4 F (36.9 C)   Resp 20   Ht 6\' 2"  (1.88 m)   Wt 127 kg (280 lb)   SpO2 97%   BMI 35.95 kg/m   Physical Exam  Constitutional: He is oriented to person, place, and time. He appears well-developed and well-nourished. No distress.  HENT:  Head: Normocephalic and atraumatic.  Mouth/Throat: Oropharynx is clear and moist. No oropharyngeal exudate.  Eyes: Pupils are equal, round, and reactive to light. Conjunctivae and EOM are normal.  Neck: Normal range of motion. Neck supple.  No meningismus.  Cardiovascular: Normal rate, regular rhythm,  normal heart sounds and intact distal pulses.   No murmur heard. Pulmonary/Chest: Effort normal and breath sounds normal. No respiratory distress.  Abdominal: Soft. There is tenderness. There is no rebound and no guarding.  TTP L sided abdomen. No guarding or rebound  Genitourinary:  Genitourinary Comments: No testicular pain  Musculoskeletal: Normal range of motion. He exhibits no tenderness.  L CVAT  Neurological: He is alert and oriented to person, place, and time. No cranial nerve deficit. He exhibits normal muscle tone. Coordination normal.   5/5 strength throughout. CN 2-12 intact.Equal grip strength.   Skin: Skin is warm.  Psychiatric: He has a normal mood and affect. His behavior is normal.  Nursing note and vitals reviewed.    ED Treatments / Results  Labs (all labs ordered are listed, but only abnormal results are displayed) Labs Reviewed  CBC WITH DIFFERENTIAL/PLATELET -  Abnormal; Notable for the following:       Result Value   WBC 12.0 (*)    Neutro Abs 8.4 (*)    Monocytes Absolute 1.3 (*)    All other components within normal limits  BASIC METABOLIC PANEL - Abnormal; Notable for the following:    Glucose, Bld 186 (*)    Creatinine, Ser 1.25 (*)    Calcium 10.6 (*)    GFR calc non Af Amer 58 (*)    All other components within normal limits  URINALYSIS, ROUTINE W REFLEX MICROSCOPIC - Abnormal; Notable for the following:    APPearance CLOUDY (*)    All other components within normal limits  URINE CULTURE    EKG  EKG Interpretation None       Radiology Ct Renal Stone Study  Result Date: 06/29/2017 CLINICAL DATA:  LEFT flank pain radiating to lower abdomen. History of kidney stones, diabetes. EXAM: CT ABDOMEN AND PELVIS WITHOUT CONTRAST TECHNIQUE: Multidetector CT imaging of the abdomen and pelvis was performed following the standard protocol without IV contrast. COMPARISON:  CT abdomen and pelvis April 20, 2011 FINDINGS: LOWER CHEST: Lung bases are clear. The  visualized heart size is normal. Mild coronary artery calcifications, incompletely assessed. No pericardial effusion. Small fat containing hernia at crus of diaphragm, unchanged. HEPATOBILIARY: The liver is diffusely hypodense compatible with steatosis with mild focal fatty sparing about the gallbladder fossa. Normal gallbladder. PANCREAS: Normal. SPLEEN: Normal. ADRENALS/URINARY TRACT: Kidneys are orthotopic, demonstrating normal size and morphology. Mild LEFT hydroureteronephrosis to the proximal ureter where a 6 mm calculus is present. No nephrolithiasis. 2.4 cm cyst RIGHT kidney. Minimal RIGHT capsular calcification. Limited assessment for renal masses on this nonenhanced examination. Urinary bladder is partially distended and unremarkable. Normal adrenal glands. STOMACH/BOWEL: The stomach, small and large bowel are normal in course and caliber without inflammatory changes, sensitivity decreased by lack of enteric contrast. Mild colonic diverticulosis. Normal appendix. VASCULAR/LYMPHATIC: Aortoiliac vessels are normal in course and caliber. Mild aortic atherosclerosis. No lymphadenopathy by CT size criteria. REPRODUCTIVE: Mild prostatomegaly deforming the base of the bladder. OTHER: No intraperitoneal free fluid or free air. MUSCULOSKELETAL: Non-acute. Anterior abdominal wall scarring. Moderate sacroiliac osteoarthrosis. Grade 1 L5-S1 anterolisthesis without spondylolysis. Multilevel severe degenerative change of the lumbar spine. IMPRESSION: 1. 6 mm proximal ureteral calculus resulting in mild obstructive uropathy. 2. Hepatic steatosis. Aortic Atherosclerosis (ICD10-I70.0). Electronically Signed   By: Elon Alas M.D.   On: 06/29/2017 01:21    Procedures Procedures (including critical care time)  Medications Ordered in ED Medications  morphine 4 MG/ML injection 4 mg (not administered)  ondansetron (ZOFRAN) injection 4 mg (not administered)  sodium chloride 0.9 % bolus 1,000 mL (not administered)      Initial Impression / Assessment and Plan / ED Course  I have reviewed the triage vital signs and the nursing notes.  Pertinent labs & imaging results that were available during my care of the patient were reviewed by me and considered in my medical decision making (see chart for details).     Flank pain and lower abdominal pain similar to previous kidney stones. No fever or vomiting.  Urinalysis shows no hematuria without infection. Creatinine mildly elevated. CT scan confirms 6 mm proximal left ureteral stone. There is no evidence of urinary tract infection. Pain is controlled and patient is tolerating by mouth.  Patient given symptom control and IV fluids in the ED. He is feeling improved his pain is controlled and is tolerating by mouth. No evidence of  infection.  Patient will be discharged with symptomatic treatment and urology follow-up. Return precautions discussed including worsening pain, vomiting or fever.  Final Clinical Impressions(s) / ED Diagnoses   Final diagnoses:  Ureterolithiasis    New Prescriptions New Prescriptions   No medications on file     Ezequiel Essex, MD 06/29/17 [phone]

## 2017-06-29 NOTE — Discharge Instructions (Signed)
Take the pain and nausea medication as prescribed. Follow-up with the urologist. Return to ED if you develop worsening pain, vomiting, fever or any other concerns.

## 2017-06-29 NOTE — ED Notes (Signed)
Pt drinking water at this time.

## 2017-06-30 LAB — URINE CULTURE: CULTURE: NO GROWTH

## 2017-07-16 DIAGNOSIS — K210 Gastro-esophageal reflux disease with esophagitis: Secondary | ICD-10-CM | POA: Diagnosis not present

## 2017-07-16 DIAGNOSIS — E785 Hyperlipidemia, unspecified: Secondary | ICD-10-CM | POA: Diagnosis not present

## 2017-07-16 DIAGNOSIS — Z7984 Long term (current) use of oral hypoglycemic drugs: Secondary | ICD-10-CM | POA: Diagnosis not present

## 2017-07-16 DIAGNOSIS — R110 Nausea: Secondary | ICD-10-CM | POA: Diagnosis not present

## 2017-07-16 DIAGNOSIS — Z6837 Body mass index (BMI) 37.0-37.9, adult: Secondary | ICD-10-CM | POA: Diagnosis not present

## 2017-07-16 DIAGNOSIS — N201 Calculus of ureter: Secondary | ICD-10-CM | POA: Diagnosis not present

## 2017-07-16 DIAGNOSIS — G4733 Obstructive sleep apnea (adult) (pediatric): Secondary | ICD-10-CM | POA: Diagnosis not present

## 2017-07-16 DIAGNOSIS — K222 Esophageal obstruction: Secondary | ICD-10-CM | POA: Diagnosis not present

## 2017-07-16 DIAGNOSIS — I10 Essential (primary) hypertension: Secondary | ICD-10-CM | POA: Diagnosis not present

## 2017-07-16 DIAGNOSIS — E119 Type 2 diabetes mellitus without complications: Secondary | ICD-10-CM | POA: Diagnosis not present

## 2017-07-16 DIAGNOSIS — N400 Enlarged prostate without lower urinary tract symptoms: Secondary | ICD-10-CM | POA: Diagnosis not present

## 2017-07-16 DIAGNOSIS — E669 Obesity, unspecified: Secondary | ICD-10-CM | POA: Diagnosis not present

## 2017-07-16 DIAGNOSIS — Z87442 Personal history of urinary calculi: Secondary | ICD-10-CM | POA: Diagnosis not present

## 2017-07-23 DIAGNOSIS — K210 Gastro-esophageal reflux disease with esophagitis: Secondary | ICD-10-CM | POA: Diagnosis not present

## 2017-07-23 DIAGNOSIS — N400 Enlarged prostate without lower urinary tract symptoms: Secondary | ICD-10-CM | POA: Diagnosis not present

## 2017-07-23 DIAGNOSIS — I10 Essential (primary) hypertension: Secondary | ICD-10-CM | POA: Diagnosis not present

## 2017-07-23 DIAGNOSIS — G4733 Obstructive sleep apnea (adult) (pediatric): Secondary | ICD-10-CM | POA: Diagnosis not present

## 2017-07-23 DIAGNOSIS — E119 Type 2 diabetes mellitus without complications: Secondary | ICD-10-CM | POA: Diagnosis not present

## 2017-07-23 DIAGNOSIS — N201 Calculus of ureter: Secondary | ICD-10-CM | POA: Diagnosis not present

## 2017-08-08 DIAGNOSIS — R972 Elevated prostate specific antigen [PSA]: Secondary | ICD-10-CM | POA: Diagnosis not present

## 2017-08-08 DIAGNOSIS — N401 Enlarged prostate with lower urinary tract symptoms: Secondary | ICD-10-CM | POA: Diagnosis not present

## 2017-08-08 DIAGNOSIS — Z466 Encounter for fitting and adjustment of urinary device: Secondary | ICD-10-CM | POA: Diagnosis not present

## 2017-08-08 DIAGNOSIS — Z87442 Personal history of urinary calculi: Secondary | ICD-10-CM | POA: Diagnosis not present

## 2017-08-08 DIAGNOSIS — N200 Calculus of kidney: Secondary | ICD-10-CM | POA: Diagnosis not present

## 2017-08-08 DIAGNOSIS — N201 Calculus of ureter: Secondary | ICD-10-CM | POA: Diagnosis not present

## 2017-10-11 DIAGNOSIS — E785 Hyperlipidemia, unspecified: Secondary | ICD-10-CM | POA: Diagnosis not present

## 2017-10-11 DIAGNOSIS — I10 Essential (primary) hypertension: Secondary | ICD-10-CM | POA: Diagnosis not present

## 2017-10-11 DIAGNOSIS — Z79899 Other long term (current) drug therapy: Secondary | ICD-10-CM | POA: Diagnosis not present

## 2017-10-11 DIAGNOSIS — E1129 Type 2 diabetes mellitus with other diabetic kidney complication: Secondary | ICD-10-CM | POA: Diagnosis not present

## 2017-10-18 DIAGNOSIS — E785 Hyperlipidemia, unspecified: Secondary | ICD-10-CM | POA: Diagnosis not present

## 2017-10-18 DIAGNOSIS — K760 Fatty (change of) liver, not elsewhere classified: Secondary | ICD-10-CM | POA: Diagnosis not present

## 2017-10-18 DIAGNOSIS — Z23 Encounter for immunization: Secondary | ICD-10-CM | POA: Diagnosis not present

## 2017-10-18 DIAGNOSIS — E1122 Type 2 diabetes mellitus with diabetic chronic kidney disease: Secondary | ICD-10-CM | POA: Diagnosis not present

## 2017-11-07 DIAGNOSIS — Z09 Encounter for follow-up examination after completed treatment for conditions other than malignant neoplasm: Secondary | ICD-10-CM | POA: Diagnosis not present

## 2017-11-07 DIAGNOSIS — E291 Testicular hypofunction: Secondary | ICD-10-CM | POA: Diagnosis not present

## 2017-11-07 DIAGNOSIS — N211 Calculus in urethra: Secondary | ICD-10-CM | POA: Diagnosis not present

## 2017-11-07 DIAGNOSIS — N200 Calculus of kidney: Secondary | ICD-10-CM | POA: Diagnosis not present

## 2017-11-07 DIAGNOSIS — N201 Calculus of ureter: Secondary | ICD-10-CM | POA: Diagnosis not present

## 2017-11-07 DIAGNOSIS — R972 Elevated prostate specific antigen [PSA]: Secondary | ICD-10-CM | POA: Diagnosis not present

## 2017-11-07 DIAGNOSIS — N401 Enlarged prostate with lower urinary tract symptoms: Secondary | ICD-10-CM | POA: Diagnosis not present

## 2017-12-13 DIAGNOSIS — R7989 Other specified abnormal findings of blood chemistry: Secondary | ICD-10-CM | POA: Diagnosis not present

## 2018-01-20 ENCOUNTER — Other Ambulatory Visit: Payer: Self-pay | Admitting: Family Medicine

## 2018-01-20 DIAGNOSIS — I10 Essential (primary) hypertension: Secondary | ICD-10-CM | POA: Diagnosis not present

## 2018-01-20 DIAGNOSIS — E1129 Type 2 diabetes mellitus with other diabetic kidney complication: Secondary | ICD-10-CM | POA: Diagnosis not present

## 2018-01-20 DIAGNOSIS — E291 Testicular hypofunction: Secondary | ICD-10-CM | POA: Diagnosis not present

## 2018-01-20 DIAGNOSIS — R7989 Other specified abnormal findings of blood chemistry: Secondary | ICD-10-CM | POA: Diagnosis not present

## 2018-01-21 LAB — BASIC METABOLIC PANEL
BUN / CREAT RATIO: 14 (ref 10–24)
BUN: 13 mg/dL (ref 8–27)
CALCIUM: 9.7 mg/dL (ref 8.6–10.2)
CO2: 22 mmol/L (ref 20–29)
Chloride: 99 mmol/L (ref 96–106)
Creatinine, Ser: 0.93 mg/dL (ref 0.76–1.27)
GFR, EST AFRICAN AMERICAN: 99 mL/min/{1.73_m2} (ref >59)
GFR, EST NON AFRICAN AMERICAN: 85 mL/min/{1.73_m2} (ref >59)
Glucose: 141 mg/dL — ABNORMAL HIGH (ref 65–99)
Potassium: 4.8 mmol/L (ref 3.5–5.2)
Sodium: 136 mmol/L (ref 134–144)

## 2018-01-21 LAB — HEPATIC FUNCTION PANEL
ALT: 45 IU/L — AB (ref 0–44)
AST: 40 IU/L (ref 0–40)
Albumin: 4.4 g/dL (ref 3.6–4.8)
Alkaline Phosphatase: 78 IU/L (ref 39–117)
BILIRUBIN TOTAL: 0.4 mg/dL (ref 0.0–1.2)
BILIRUBIN, DIRECT: 0.13 mg/dL (ref 0.00–0.40)
Total Protein: 6.4 g/dL (ref 6.0–8.5)

## 2018-01-21 LAB — CBC WITH DIFFERENTIAL/PLATELET
BASOS: 0 %
Basophils Absolute: 0 10*3/uL (ref 0.0–0.2)
EOS (ABSOLUTE): 0.3 10*3/uL (ref 0.0–0.4)
Eos: 5 %
HEMATOCRIT: 43.2 % (ref 37.5–51.0)
Hemoglobin: 14.1 g/dL (ref 13.0–17.7)
IMMATURE GRANS (ABS): 0 10*3/uL (ref 0.0–0.1)
IMMATURE GRANULOCYTES: 0 %
Lymphocytes Absolute: 2.2 10*3/uL (ref 0.7–3.1)
Lymphs: 35 %
MCH: 29.9 pg (ref 26.6–33.0)
MCHC: 32.6 g/dL (ref 31.5–35.7)
MCV: 92 fL (ref 79–97)
MONOS ABS: 0.5 10*3/uL (ref 0.1–0.9)
Monocytes: 8 %
NEUTROS PCT: 52 %
Neutrophils Absolute: 3.3 10*3/uL (ref 1.4–7.0)
PLATELETS: 231 10*3/uL (ref 150–379)
RBC: 4.72 x10E6/uL (ref 4.14–5.80)
RDW: 13.9 % (ref 12.3–15.4)
WBC: 6.4 10*3/uL (ref 3.4–10.8)

## 2018-01-21 LAB — TESTOSTERONE: TESTOSTERONE: 300 ng/dL (ref 264–916)

## 2019-01-19 DIAGNOSIS — R972 Elevated prostate specific antigen [PSA]: Secondary | ICD-10-CM | POA: Diagnosis not present

## 2019-01-19 DIAGNOSIS — I10 Essential (primary) hypertension: Secondary | ICD-10-CM | POA: Diagnosis not present

## 2019-01-19 DIAGNOSIS — N529 Male erectile dysfunction, unspecified: Secondary | ICD-10-CM | POA: Diagnosis not present

## 2019-01-19 DIAGNOSIS — Z125 Encounter for screening for malignant neoplasm of prostate: Secondary | ICD-10-CM | POA: Diagnosis not present

## 2019-01-19 DIAGNOSIS — E785 Hyperlipidemia, unspecified: Secondary | ICD-10-CM | POA: Diagnosis not present

## 2019-01-19 DIAGNOSIS — Z79899 Other long term (current) drug therapy: Secondary | ICD-10-CM | POA: Diagnosis not present

## 2019-01-26 DIAGNOSIS — E1129 Type 2 diabetes mellitus with other diabetic kidney complication: Secondary | ICD-10-CM | POA: Diagnosis not present

## 2019-01-26 DIAGNOSIS — Z6839 Body mass index (BMI) 39.0-39.9, adult: Secondary | ICD-10-CM | POA: Diagnosis not present

## 2019-01-26 DIAGNOSIS — E785 Hyperlipidemia, unspecified: Secondary | ICD-10-CM | POA: Diagnosis not present

## 2019-06-02 DIAGNOSIS — I10 Essential (primary) hypertension: Secondary | ICD-10-CM | POA: Diagnosis not present

## 2019-06-02 DIAGNOSIS — E1129 Type 2 diabetes mellitus with other diabetic kidney complication: Secondary | ICD-10-CM | POA: Diagnosis not present

## 2019-06-04 DIAGNOSIS — N3001 Acute cystitis with hematuria: Secondary | ICD-10-CM | POA: Diagnosis not present

## 2019-06-14 IMAGING — CT CT RENAL STONE PROTOCOL
2 of 4 series · 16 of 46 positions shown, 18 images · non-contrast
Comparison: CT abdomen and pelvis April 20, 2011

CLINICAL DATA: LEFT flank pain radiating to lower abdomen. History
of kidney stones, diabetes.

EXAM:
CT ABDOMEN AND PELVIS WITHOUT CONTRAST
TECHNIQUE: Multidetector CT imaging of the abdomen and pelvis was performed
following the standard protocol without IV contrast.

[Series 2: axial st · axial · 0.98mm/px · z∈[-523,-88]mm · 13 of 97 slices shown, 15 images]
[im 5/97  soft-tissue]
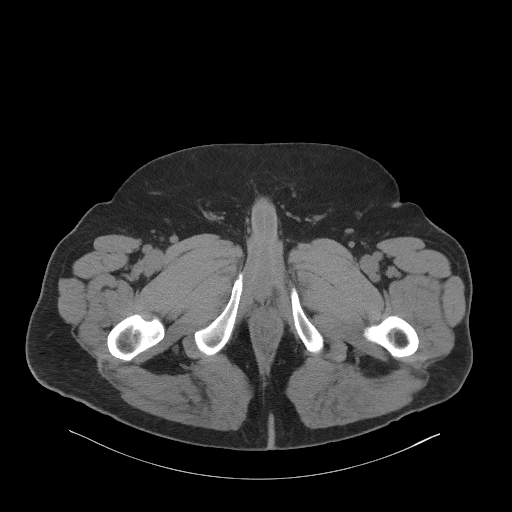
[im 5/97  bone]
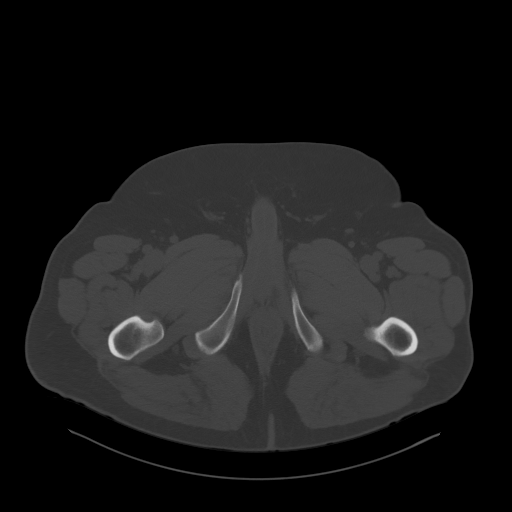
[im 14/97  soft-tissue]
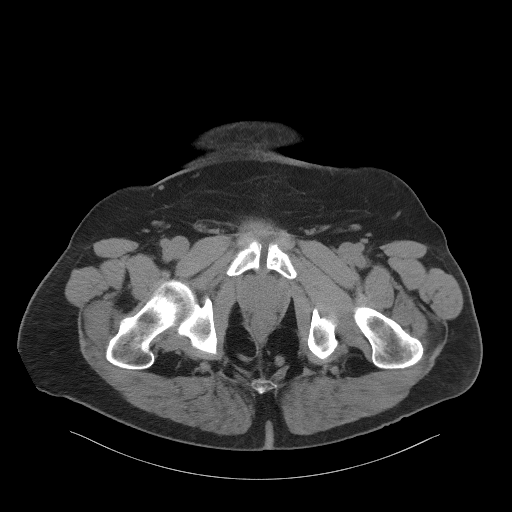
[im 22/97  soft-tissue]
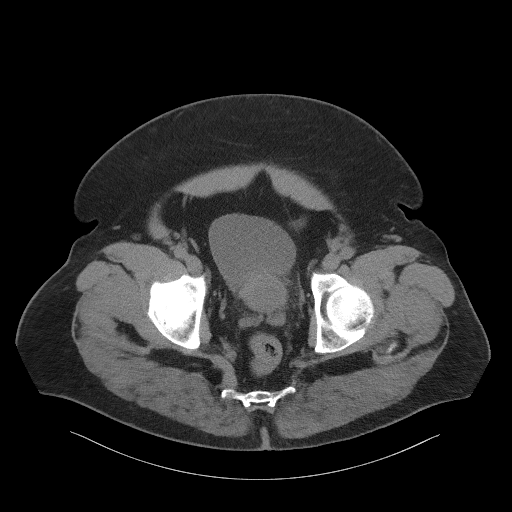
[im 27/97  soft-tissue]
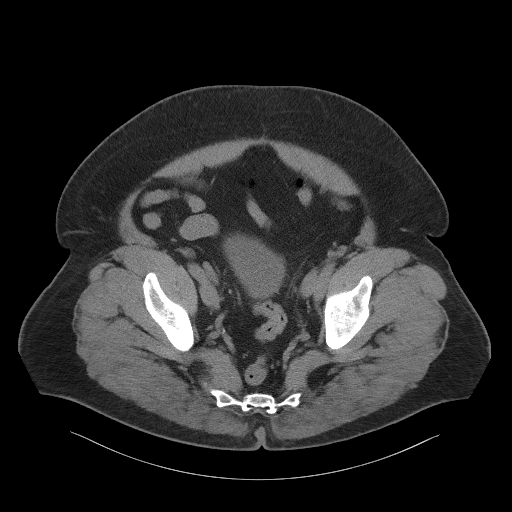
[im 35/97  soft-tissue]
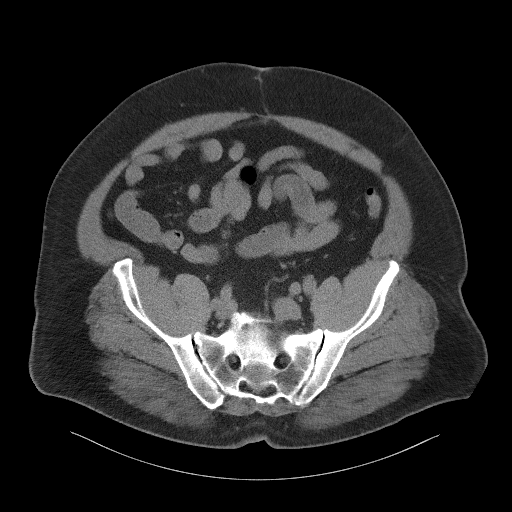
[im 40/97  soft-tissue]
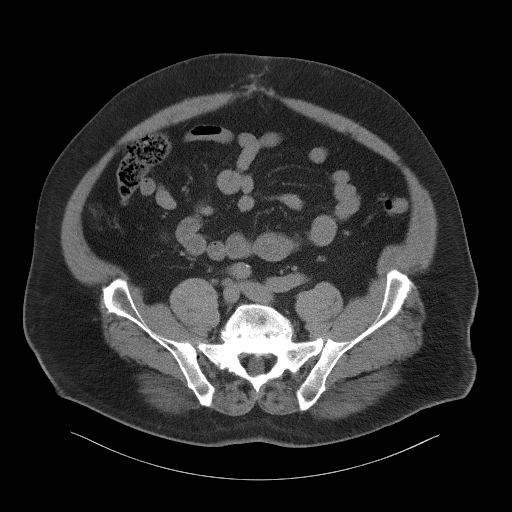
[im 49/97  soft-tissue]
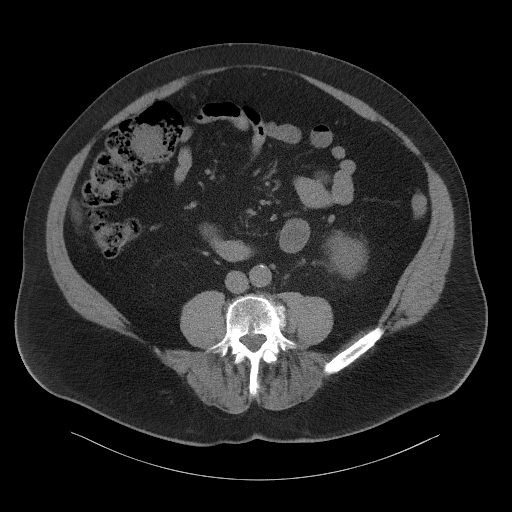
[im 57/97  soft-tissue]
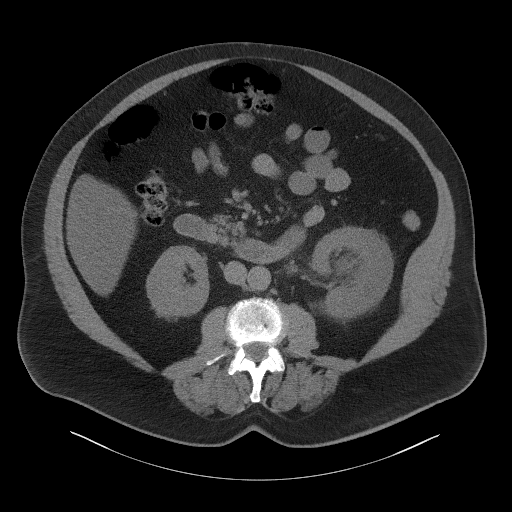
[im 62/97  soft-tissue]
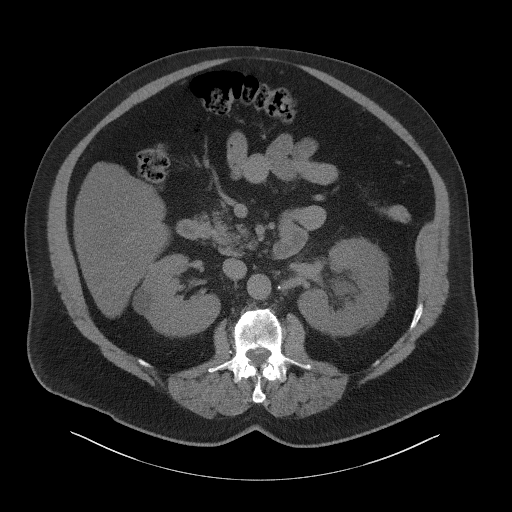
[im 62/97  bone]
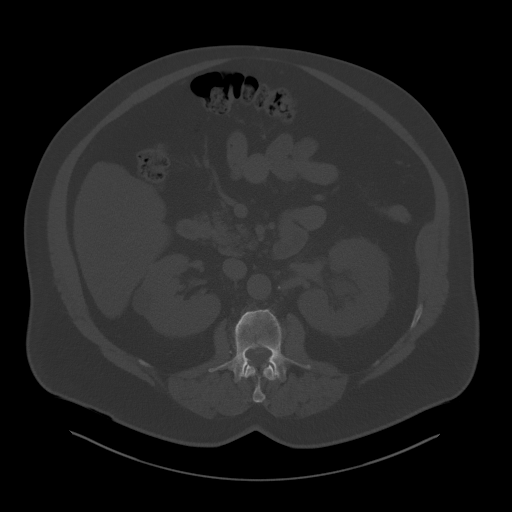
[im 70/97  soft-tissue]
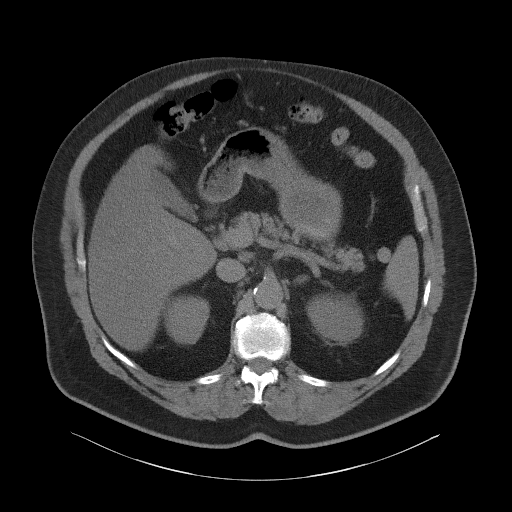
[im 75/97  soft-tissue]
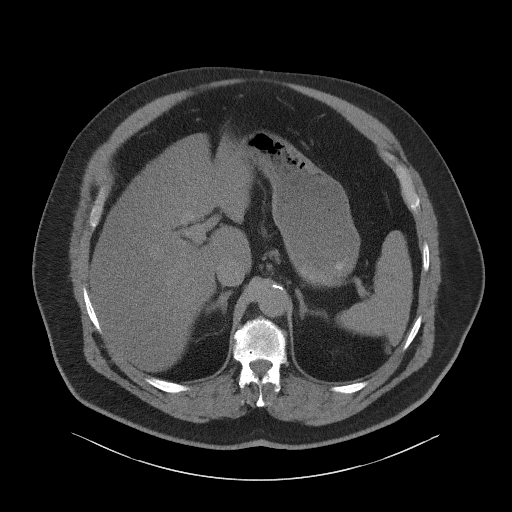
[im 83/97  soft-tissue]
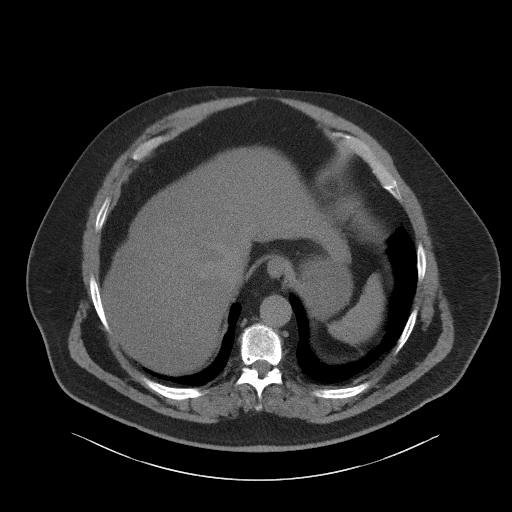
[im 92/97  soft-tissue]
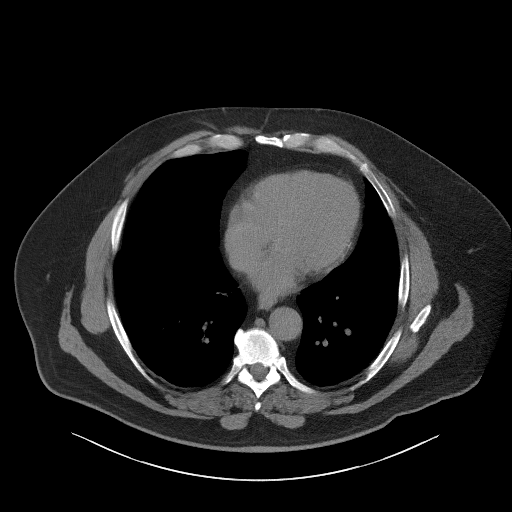

[Series 5: coronal st · coronal · 0.94mm/px · 3 of 122 slices shown]
[im 41/122  soft-tissue]
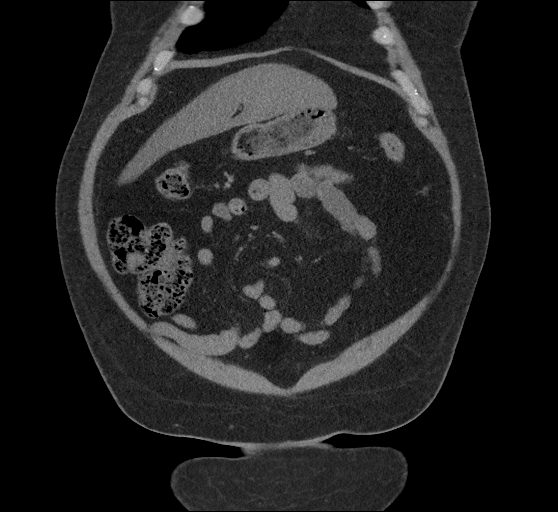
[im 54/122  soft-tissue]
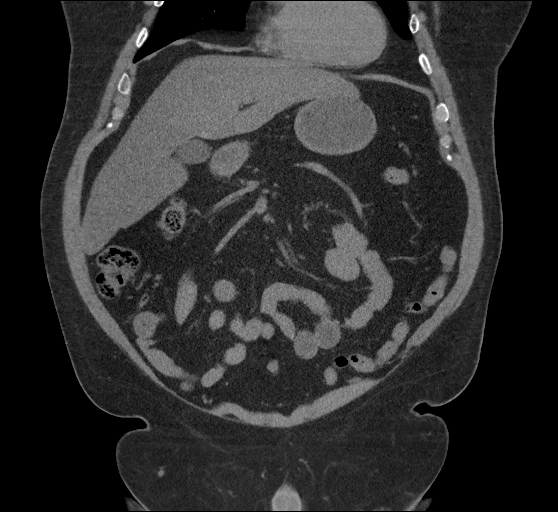
[im 68/122  soft-tissue]
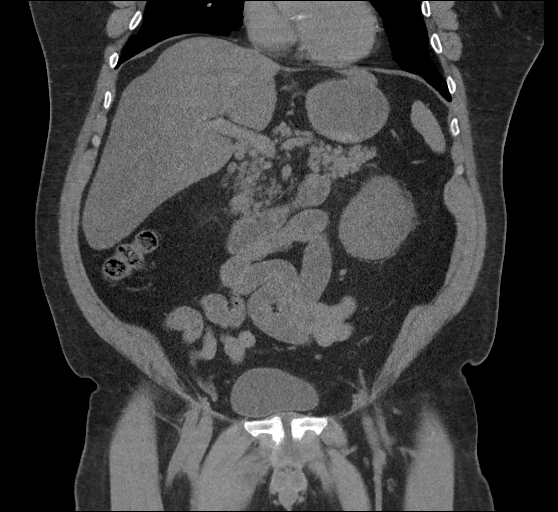

[16 of 46 positions shown; findings below may reference images not displayed]

FINDINGS: LOWER CHEST: Lung bases are clear. The visualized heart size is
normal. Mild coronary artery calcifications, incompletely assessed.
No pericardial effusion. Small fat containing hernia at crus of
diaphragm, unchanged.

HEPATOBILIARY: The liver is diffusely hypodense compatible with
steatosis with mild focal fatty sparing about the gallbladder fossa.
Normal gallbladder.

PANCREAS: Normal.

SPLEEN: Normal.

ADRENALS/URINARY TRACT: Kidneys are orthotopic, demonstrating normal
size and morphology. Mild LEFT hydroureteronephrosis to the proximal
ureter where a 6 mm calculus is present. No nephrolithiasis. 2.4 cm
cyst RIGHT kidney. Minimal RIGHT capsular calcification. Limited
assessment for renal masses on this nonenhanced examination. Urinary
bladder is partially distended and unremarkable. Normal adrenal
glands.

STOMACH/BOWEL: The stomach, small and large bowel are normal in
course and caliber without inflammatory changes, sensitivity
decreased by lack of enteric contrast. Mild colonic diverticulosis.
Normal appendix.

VASCULAR/LYMPHATIC: Aortoiliac vessels are normal in course and
caliber. Mild aortic atherosclerosis. No lymphadenopathy by CT size
criteria.

REPRODUCTIVE: Mild prostatomegaly deforming the base of the bladder.

OTHER: No intraperitoneal free fluid or free air.

MUSCULOSKELETAL: Non-acute. Anterior abdominal wall scarring.
Moderate sacroiliac osteoarthrosis. Grade 1 L5-S1 anterolisthesis
without spondylolysis. Multilevel severe degenerative change of the
lumbar spine.
IMPRESSION: 1. 6 mm proximal ureteral calculus resulting in mild obstructive
uropathy.
2. Hepatic steatosis.

Aortic Atherosclerosis ([id number]).

## 2019-10-07 DIAGNOSIS — E1129 Type 2 diabetes mellitus with other diabetic kidney complication: Secondary | ICD-10-CM | POA: Diagnosis not present

## 2019-10-07 DIAGNOSIS — E119 Type 2 diabetes mellitus without complications: Secondary | ICD-10-CM | POA: Diagnosis not present

## 2019-10-07 DIAGNOSIS — I10 Essential (primary) hypertension: Secondary | ICD-10-CM | POA: Diagnosis not present

## 2020-01-29 DIAGNOSIS — E785 Hyperlipidemia, unspecified: Secondary | ICD-10-CM | POA: Diagnosis not present

## 2020-01-29 DIAGNOSIS — I10 Essential (primary) hypertension: Secondary | ICD-10-CM | POA: Diagnosis not present

## 2020-01-29 DIAGNOSIS — Z79899 Other long term (current) drug therapy: Secondary | ICD-10-CM | POA: Diagnosis not present

## 2020-01-29 DIAGNOSIS — E669 Obesity, unspecified: Secondary | ICD-10-CM | POA: Diagnosis not present

## 2020-01-29 DIAGNOSIS — E1129 Type 2 diabetes mellitus with other diabetic kidney complication: Secondary | ICD-10-CM | POA: Diagnosis not present

## 2020-02-05 DIAGNOSIS — E1129 Type 2 diabetes mellitus with other diabetic kidney complication: Secondary | ICD-10-CM | POA: Diagnosis not present

## 2020-02-05 DIAGNOSIS — I10 Essential (primary) hypertension: Secondary | ICD-10-CM | POA: Diagnosis not present

## 2020-02-05 DIAGNOSIS — G4733 Obstructive sleep apnea (adult) (pediatric): Secondary | ICD-10-CM | POA: Diagnosis not present

## 2020-02-05 DIAGNOSIS — E785 Hyperlipidemia, unspecified: Secondary | ICD-10-CM | POA: Diagnosis not present

## 2020-03-08 ENCOUNTER — Ambulatory Visit: Payer: Medicare Other | Attending: Internal Medicine

## 2020-03-08 ENCOUNTER — Other Ambulatory Visit: Payer: Self-pay

## 2020-03-08 DIAGNOSIS — Z20822 Contact with and (suspected) exposure to covid-19: Secondary | ICD-10-CM

## 2020-03-09 LAB — SARS-COV-2, NAA 2 DAY TAT

## 2020-03-09 LAB — NOVEL CORONAVIRUS, NAA: SARS-CoV-2, NAA: NOT DETECTED

## 2020-03-28 DIAGNOSIS — N390 Urinary tract infection, site not specified: Secondary | ICD-10-CM | POA: Diagnosis not present

## 2020-04-28 DIAGNOSIS — R631 Polydipsia: Secondary | ICD-10-CM | POA: Diagnosis not present

## 2020-04-28 DIAGNOSIS — R350 Frequency of micturition: Secondary | ICD-10-CM | POA: Diagnosis not present

## 2020-04-28 DIAGNOSIS — R3915 Urgency of urination: Secondary | ICD-10-CM | POA: Diagnosis not present

## 2020-04-28 DIAGNOSIS — R972 Elevated prostate specific antigen [PSA]: Secondary | ICD-10-CM | POA: Diagnosis not present

## 2020-04-28 DIAGNOSIS — R358 Other polyuria: Secondary | ICD-10-CM | POA: Diagnosis not present

## 2020-04-28 DIAGNOSIS — N200 Calculus of kidney: Secondary | ICD-10-CM | POA: Diagnosis not present

## 2020-04-28 DIAGNOSIS — N401 Enlarged prostate with lower urinary tract symptoms: Secondary | ICD-10-CM | POA: Diagnosis not present

## 2020-04-29 DIAGNOSIS — E1165 Type 2 diabetes mellitus with hyperglycemia: Secondary | ICD-10-CM | POA: Diagnosis not present

## 2020-05-04 ENCOUNTER — Encounter (INDEPENDENT_AMBULATORY_CARE_PROVIDER_SITE_OTHER): Payer: Self-pay | Admitting: Gastroenterology

## 2020-06-07 DIAGNOSIS — R972 Elevated prostate specific antigen [PSA]: Secondary | ICD-10-CM | POA: Diagnosis not present

## 2020-06-07 DIAGNOSIS — E1129 Type 2 diabetes mellitus with other diabetic kidney complication: Secondary | ICD-10-CM | POA: Diagnosis not present

## 2020-06-07 DIAGNOSIS — Z79899 Other long term (current) drug therapy: Secondary | ICD-10-CM | POA: Diagnosis not present

## 2020-06-14 DIAGNOSIS — E1129 Type 2 diabetes mellitus with other diabetic kidney complication: Secondary | ICD-10-CM | POA: Diagnosis not present

## 2020-06-14 DIAGNOSIS — R972 Elevated prostate specific antigen [PSA]: Secondary | ICD-10-CM | POA: Diagnosis not present

## 2020-06-23 ENCOUNTER — Other Ambulatory Visit: Payer: Self-pay

## 2020-06-23 ENCOUNTER — Other Ambulatory Visit (INDEPENDENT_AMBULATORY_CARE_PROVIDER_SITE_OTHER): Payer: Self-pay | Admitting: *Deleted

## 2020-06-23 ENCOUNTER — Encounter (INDEPENDENT_AMBULATORY_CARE_PROVIDER_SITE_OTHER): Payer: Self-pay | Admitting: Gastroenterology

## 2020-06-23 ENCOUNTER — Encounter (INDEPENDENT_AMBULATORY_CARE_PROVIDER_SITE_OTHER): Payer: Self-pay | Admitting: *Deleted

## 2020-06-23 ENCOUNTER — Ambulatory Visit (INDEPENDENT_AMBULATORY_CARE_PROVIDER_SITE_OTHER): Payer: Medicare Other | Admitting: Gastroenterology

## 2020-06-23 VITALS — BP 114/78 | HR 84 | Temp 99.2°F | Ht 74.0 in | Wt 281.0 lb

## 2020-06-23 DIAGNOSIS — K200 Eosinophilic esophagitis: Secondary | ICD-10-CM

## 2020-06-23 DIAGNOSIS — R1310 Dysphagia, unspecified: Secondary | ICD-10-CM | POA: Insufficient documentation

## 2020-06-23 NOTE — Patient Instructions (Signed)
Schedule EGD

## 2020-06-23 NOTE — Progress Notes (Signed)
Micheal [name], M.D. Gastroenterology & Hepatology [facility] [address] [place], [place] [address] Primary Care Physician: Asencion Noble, MD [address] [place] [address]  Referring MD: PCP  I will communicate my assessment and recommendations to the referring MD via EMR. Note: Occasional unusual wording and randomly placed punctuation marks may result from the use of speech recognition technology to transcribe this document"  Chief Complaint: Dysphagia  History of Present Illness: Micheal [name]. is a 69 y.o. male with past medical history of diabetes, hyperlipidemia, hypertension, OSA, eosinophilic esophagitis who presents for evaluation of recurrent dysphagia.  The patient reports that for the last 5 months he has had recurrent episodes of dysphagia to solids which have affected his intake of food.  He reports that the episodes are intermittent and worse when he eats meat like chicken or beef.  Denies having any changes in his appetite but he reports that he lost 15 pounds a few months ago as he was not taking some of his antidiabetic medications.  He denies any odynophagia.  Patient is currently not taking any PPI or steroid. The patient reports that he has had to cough occasionally to remove fluid from his chest as he has had persistent discomfort. Most of the times the food eventually goes down after a few minutes. The patient denies having any nausea, vomiting, fever, chills, hematochezia, melena, hematemesis, abdominal distention, abdominal pain, diarrhea, jaundice, pruritus.  Notably, the patient followed in our clinic until 2017.  He had an episode of food impaction in 2015 for which he had to undergo an EGD in the ER.  He had passed his food but he had findings concerning for eosinophilic esophagitis as he had first with excoriations and friable mucosa.  Biopsies were consistent with eosinophilic esophagitis as he  had multiple eosinophils in all fields.  The patient was given a trial of 6 weeks of fluticasone twice daily in thousand 16 with improvement of his symptoms.  He was subsequently seen in April 2017, at that time he was not on any medication and he was tolerating diet adequately.  He did not follow-up afterwards.  Last EGD: 11/16/2014-presence of rings in mid esophagus with white exudates. Last Colonoscopy:03/20/2017-diverticulosis in sigmoid colon.  Repeat colonoscopy in 5 years  FHx: neg for any gastrointestinal/liver disease, mother CRC in her 59s Social: neg smoking, alcohol or illicit drug use Surgical: non contributory  Past Medical History: Past Medical History:  Diagnosis Date  . Diabetes mellitus    NIDDM  . Hyperlipidemia   . Kidney stones   . Seasonal allergies   . Sleep apnea sleep study 12/08/2007   no CPAP use; uses a mouth guard at night  . Umbilical hernia     Past Surgical History: Past Surgical History:  Procedure Laterality Date  . COLONOSCOPY  10/28/2006; 10/23/2011  . COLONOSCOPY  10/23/2011   SLF:  1). Internal hemorroids, small  2). POLYPOID LESION (AC) . next TCS 10/2016  . COLONOSCOPY N/A 03/20/2017   Procedure: COLONOSCOPY;  Surgeon: Rogene Houston, MD;  [facility];  Service: Endoscopy;  Laterality: N/A;  1030-rescheduled 03/20/17 per Lelon Frohlich - pt to arrive at 10:00  . CYSTOSCOPY W/ URETERAL STENT PLACEMENT  04/12/2011   right retrograde; basketing of stone  . ESOPHAGOGASTRODUODENOSCOPY N/A 10/12/2014   RMR:  abnormal esophagus as described above query EOE status post biopsy; food impaction passes spontaneously with intubation of the esophagus c/w EOE, probable noncritical  stricture at [facility] with possible ring component. food bolus had passed.   . ESOPHAGOGASTRODUODENOSCOPY N/A 11/16/2014   Procedure: ESOPHAGOGASTRODUODENOSCOPY (EGD);  Surgeon: Daneil Dolin, MD;  [facility];  Service: Endoscopy;  Laterality: N/A;  0830  . [place] N/A 11/16/2014   Procedure: Venia Minks DILATION;  Surgeon: Daneil Dolin, MD;  [facility];  Service: Endoscopy;  Laterality: N/A;  . SAVORY DILATION N/A 11/16/2014   Procedure: SAVORY DILATION;  Surgeon: Daneil Dolin, MD;  [facility];  Service: Endoscopy;  Laterality: N/A;  . UMBILICAL HERNIA REPAIR  10/26/2011   Procedure: HERNIA REPAIR UMBILICAL ADULT;  Surgeon: Rolm Bookbinder, MD;  Location: [place];  Service: General;  Laterality: N/A;  umbilical hernia repair with mesh    Family History: Family History  Problem Relation Age of Onset  . Cancer Mother        colon, age 77 deceased  . Stroke Father   . Diabetes Brother     Social History: Social History   Tobacco Use  Smoking Status Never Smoker  Smokeless Tobacco Never Used   Social History   Substance and Sexual Activity  Alcohol Use Yes  . Alcohol/week: 1.0 - 3.0 standard drink  . Types: 1 - 3 Cans of beer per week   Comment: occasionally   Social History   Substance and Sexual Activity  Drug Use No    Allergies: No Known Allergies  Medications: Current Outpatient Medications  Medication Sig Dispense Refill  . aspirin EC 81 MG tablet Take 81 mg by mouth daily.     Marland Kitchen glimepiride (AMARYL) 1 MG tablet Take 1 mg by mouth daily with breakfast.    . ibuprofen (ADVIL,MOTRIN) 800 MG tablet Take 1 tablet (800 mg total) by mouth 3 (three) times daily. 21 tablet 0  . magnesium oxide (MAG-OX) 400 MG tablet Take 400 mg by mouth daily.    . metFORMIN (GLUCOPHAGE-XR) 500 MG 24 hr tablet Take 2,000 mg by mouth daily with breakfast.     . VICTOZA 18 MG/3ML SOPN Inject 1.2 mg into the skin daily.    Marland Kitchen atorvastatin (LIPITOR) 20 MG tablet Take 20 mg by mouth daily. (Patient not taking: Reported on 06/23/2020)    . ezetimibe (ZETIA) 10 MG tablet Take 10 mg by mouth daily.    Marland Kitchen lisinopril (PRINIVIL,ZESTRIL) 20 MG tablet Take 20 mg by mouth daily. (Patient not taking:  Reported on 06/23/2020)    . ondansetron (ZOFRAN ODT) 4 MG disintegrating tablet Take 1 tablet (4 mg total) by mouth every 8 (eight) hours as needed for nausea or vomiting. (Patient not taking: Reported on 06/23/2020) 20 tablet 0  . oxyCODONE-acetaminophen (PERCOCET/ROXICET) 5-325 MG tablet Take 1 tablet by mouth every 4 (four) hours as needed for severe pain. (Patient not taking: Reported on 06/23/2020) 15 tablet 0  . tamsulosin (FLOMAX) 0.4 MG CAPS capsule Take 1 capsule (0.4 mg total) by mouth daily. (Patient not taking: Reported on 06/23/2020) 10 capsule 0   No current facility-administered medications for this visit.    Review of Systems: GENERAL: negative for malaise, night sweats HEENT: No changes in hearing or vision, no nose bleeds or other nasal problems. NECK: Negative for lumps, goiter, pain and significant neck swelling RESPIRATORY: Negative for cough, wheezing CARDIOVASCULAR: Negative for chest pain, leg swelling, palpitations, orthopnea GI: SEE HPI MUSCULOSKELETAL: Negative for joint pain or swelling, back pain, and muscle pain. SKIN: Negative  for lesions, rash PSYCH: Negative for sleep disturbance, mood disorder and recent psychosocial stressors. HEMATOLOGY Negative for prolonged bleeding, bruising easily, and swollen nodes. ENDOCRINE: Negative for cold or heat intolerance, polyuria, polydipsia and goiter. NEURO: negative for tremor, gait imbalance, syncope and seizures. The remainder of the review of systems is noncontributory.   Physical Exam: BP 114/78 (BP Location: Right Arm, Patient Position: Sitting, Cuff Size: Large)   Pulse 84   Temp 99.2 F (37.3 C) (Oral)   Ht 6\' 2"  (1.88 m)   Wt 281 lb (127.5 kg)   BMI 36.08 kg/m  GENERAL: The patient is AO x3, in no acute distress. Obese HEENT: Head is normocephalic and atraumatic. EOMI are intact. Mouth is well hydrated and without lesions. NECK: Supple. No masses LUNGS: Clear to auscultation. No presence of  rhonchi/wheezing/rales. Adequate chest expansion HEART: RRR, normal s1 and s2. ABDOMEN: Soft, nontender, no guarding, no peritoneal signs, and nondistended. BS +. No masses. EXTREMITIES: Without any cyanosis, clubbing, rash, lesions or edema. NEUROLOGIC: AOx3, no focal motor deficit. SKIN: no jaundice, no rashes   Imaging/Labs: as above  I personally reviewed and interpreted the available labs, imaging and endoscopic files.  Impression and Plan: Micheal [name]. is a 69 y.o. male with past medical history of diabetes, hyperlipidemia, hypertension, OSA, eosinophilic esophagitis who presents for evaluation of recurrent dysphagia. The patient has a history of eosinophilic esophagitis which was treated with a short course of topical steroids with adequate response. He was given a prescription for PPI in the past as well but it is unclear why he stopped taking this medication and he cannot recall exactly taking it. Given the recurrence of his symptoms, it is likely that he has active inflammation in his esophagus, for which he will require to have an EGD with possible dilation if she has stricture but no active inflammation. If inflammation is present, will only perform esophageal biopsies and the patient will be prescribed a course of PPI twice daily. The patient understood and agreed.  More than 50% of the office visit was dedicated to discussing the procedure, including the day of and risks involved. Patient understands what the procedure involves including the benefits and any risks. Patient understands alternatives to the proposed procedure. Risks including (but not limited to) bleeding, tearing of the lining (perforation), rupture of adjacent organs, problems with heart and lung function, infection, and medication reactions. A small percentage of complications may require surgery, hospitalization, repeat endoscopic procedure, and/or transfusion. A small percentage of polyps and other tumors may  not be seen.  - Schedule EGD, possible dilation   All questions were answered.      Micheal Peppers, MD Gastroenterology and Hepatology [facility]

## 2020-06-27 ENCOUNTER — Encounter (HOSPITAL_COMMUNITY): Payer: Self-pay

## 2020-06-27 ENCOUNTER — Encounter (HOSPITAL_COMMUNITY)
Admission: RE | Admit: 2020-06-27 | Discharge: 2020-06-27 | Disposition: A | Discharge status: Home / Self Care | Payer: Medicare Other | Source: Ambulatory Visit | Attending: Gastroenterology | Admitting: Gastroenterology

## 2020-06-27 ENCOUNTER — Other Ambulatory Visit (HOSPITAL_COMMUNITY)
Admission: RE | Admit: 2020-06-27 | Discharge: 2020-06-27 | Disposition: A | Discharge status: Home / Self Care | Payer: Medicare Other | Source: Ambulatory Visit | Attending: Gastroenterology | Admitting: Gastroenterology

## 2020-06-27 ENCOUNTER — Other Ambulatory Visit: Payer: Self-pay

## 2020-06-27 DIAGNOSIS — Z01812 Encounter for preprocedural laboratory examination: Secondary | ICD-10-CM | POA: Insufficient documentation

## 2020-06-27 DIAGNOSIS — Z20822 Contact with and (suspected) exposure to covid-19: Secondary | ICD-10-CM | POA: Diagnosis not present

## 2020-06-27 HISTORY — DX: Personal history of urinary calculi: Z87.442

## 2020-06-27 LAB — BASIC METABOLIC PANEL
Anion gap: 10 (ref 5–15)
BUN: 13 mg/dL (ref 8–23)
CO2: 22 mmol/L (ref 22–32)
Calcium: 9.3 mg/dL (ref 8.9–10.3)
Chloride: 104 mmol/L (ref 98–111)
Creatinine, Ser: 0.91 mg/dL (ref 0.61–1.24)
GFR calc Af Amer: >60 mL/min (ref >60)
GFR calc non Af Amer: >60 mL/min (ref >60)
Glucose, Bld: 123 mg/dL — ABNORMAL HIGH (ref 70–99)
Potassium: 4.2 mmol/L (ref 3.5–5.1)
Sodium: 136 mmol/L (ref 135–145)

## 2020-06-27 LAB — SARS CORONAVIRUS 2 (TAT 6-24 HRS): SARS Coronavirus 2: NEGATIVE

## 2020-06-27 NOTE — Patient Instructions (Signed)
Micheal [name].  06/27/2020     @PREFPERIOPPHARMACY @   Your procedure is scheduled on  06/28/2020.  Report to [facility] at  1015  A.M.  Call this number if you have problems the morning of surgery:  [phone]   Remember:  Follow the diet and prep instructions given to you from the office.                       Take these medicines the morning of surgery with A SIP OF WATER None. DO NOT take any medications for diabetes the morning of your procedure.    Do not wear jewelry, make-up or nail polish.  Do not wear lotions, powders, or perfumes. Please wear deodorant and brush your teeth.  Do not shave 48 hours prior to surgery.  Men may shave face and neck.  Do not bring valuables to the hospital.  [facility] is not responsible for any belongings or valuables.  Contacts, dentures or bridgework may not be worn into surgery.  Leave your suitcase in the car.  After surgery it may be brought to your room.  For patients admitted to the hospital, discharge time will be determined by your treatment team.  Patients discharged the day of surgery will not be allowed to drive home.   Name and phone number of your driver:   family Special instructions:  DO NOT smoke the morning of your procedure.  Please read over the following fact sheets that you were given. Anesthesia Post-op Instructions and Care and Recovery After Surgery       Upper Endoscopy, Adult, Care After This sheet gives you information about how to care for yourself after your procedure. Your health care provider may also give you more specific instructions. If you have problems or questions, contact your health care provider. What can I expect after the procedure? After the procedure, it is common to have:  A sore throat.  Mild stomach pain or discomfort.  Bloating.  Nausea. Follow these instructions at home:   Follow instructions from your health care provider about what to eat or drink after  your procedure.  Return to your normal activities as told by your health care provider. Ask your health care provider what activities are safe for you.  Take over-the-counter and prescription medicines only as told by your health care provider.  Do not drive for 24 hours if you were given a sedative during your procedure.  Keep all follow-up visits as told by your health care provider. This is important. Contact a health care provider if you have:  A sore throat that lasts longer than one day.  Trouble swallowing. Get help right away if:  You vomit blood or your vomit looks like coffee grounds.  You have: ? A fever. ? Bloody, black, or tarry stools. ? A severe sore throat or you cannot swallow. ? Difficulty breathing. ? Severe pain in your chest or abdomen. Summary  After the procedure, it is common to have a sore throat, mild stomach discomfort, bloating, and nausea.  Do not drive for 24 hours if you were given a sedative during the procedure.  Follow instructions from your health care provider about what to eat or drink after your procedure.  Return to your normal activities as told by your health care provider. This information is not intended to replace advice given to you by your health care provider. Make sure you  discuss any questions you have with your health care provider. Document Revised: 04/29/2018 Document Reviewed: 04/07/2018 Elsevier Patient Education  [place].  Esophageal Dilatation Esophageal dilatation, also called esophageal dilation, is a procedure to widen or open (dilate) a blocked or narrowed part of the esophagus. The esophagus is the part of the body that moves food and liquid from the mouth to the stomach. You may need this procedure if:  You have a buildup of scar tissue in your esophagus that makes it difficult, painful, or impossible to swallow. This can be caused by gastroesophageal reflux disease (GERD).  You have cancer of the  esophagus.  There is a problem with how food moves through your esophagus. In some cases, you may need this procedure repeated at a later time to dilate the esophagus gradually. Tell a health care provider about:  Any allergies you have.  All medicines you are taking, including vitamins, herbs, eye drops, creams, and over-the-counter medicines.  Any problems you or family members have had with anesthetic medicines.  Any blood disorders you have.  Any surgeries you have had.  Any medical conditions you have.  Any antibiotic medicines you are required to take before dental procedures.  Whether you are pregnant or may be pregnant. What are the risks? Generally, this is a safe procedure. However, problems may occur, including:  Bleeding due to a tear in the lining of the esophagus.  A hole (perforation) in the esophagus. What happens before the procedure?  Follow instructions from your health care provider about eating or drinking restrictions.  Ask your health care provider about changing or stopping your regular medicines. This is especially important if you are taking diabetes medicines or blood thinners.  Plan to have someone take you home from the hospital or clinic.  Plan to have a responsible adult care for you for at least 24 hours after you leave the hospital or clinic. This is important. What happens during the procedure?  You may be given a medicine to help you relax (sedative).  A numbing medicine may be sprayed into the back of your throat, or you may gargle the medicine.  Your health care provider may perform the dilatation using various surgical instruments, such as: ? Simple dilators. This instrument is carefully placed in the esophagus to stretch it. ? Guided wire bougies. This involves using an endoscope to insert a wire into the esophagus. A dilator is passed over this wire to enlarge the esophagus. Then the wire is removed. ? Balloon dilators. An endoscope  with a small balloon at the end is inserted into the esophagus. The balloon is inflated to stretch the esophagus and open it up. The procedure may vary among health care providers and hospitals. What happens after the procedure?  Your blood pressure, heart rate, breathing rate, and blood oxygen level will be monitored until the medicines you were given have worn off.  Your throat may feel slightly sore and numb. This will improve slowly over time.  You will not be allowed to eat or drink until your throat is no longer numb.  When you are able to drink, urinate, and sit on the edge of the bed without nausea or dizziness, you may be able to return home. Follow these instructions at home:  Take over-the-counter and prescription medicines only as told by your health care provider.  Do not drive for 24 hours if you were given a sedative during your procedure.  You should have a responsible adult  with you for 24 hours after the procedure.  Follow instructions from your health care provider about any eating or drinking restrictions.  Do not use any products that contain nicotine or tobacco, such as cigarettes and e-cigarettes. If you need help quitting, ask your health care provider.  Keep all follow-up visits as told by your health care provider. This is important. Get help right away if you:  Have a fever.  Have chest pain.  Have pain that is not relieved by medication.  Have trouble breathing.  Have trouble swallowing.  Vomit blood. Summary  Esophageal dilatation, also called esophageal dilation, is a procedure to widen or open (dilate) a blocked or narrowed part of the esophagus.  Plan to have someone take you home from the hospital or clinic.  For this procedure, a numbing medicine may be sprayed into the back of your throat, or you may gargle the medicine.  Do not drive for 24 hours if you were given a sedative during your procedure. This information is not intended to  replace advice given to you by your health care provider. Make sure you discuss any questions you have with your health care provider. Document Revised: 09/02/2019 Document Reviewed: 09/10/2017 Elsevier Patient Education  2020 [place] After These instructions provide you with information about caring for yourself after your procedure. Your health care provider may also give you more specific instructions. Your treatment has been planned according to current medical practices, but problems sometimes occur. Call your health care provider if you have any problems or questions after your procedure. What can I expect after the procedure? After your procedure, you may:  Feel sleepy for several hours.  Feel clumsy and have poor balance for several hours.  Feel forgetful about what happened after the procedure.  Have poor judgment for several hours.  Feel nauseous or vomit.  Have a sore throat if you had a breathing tube during the procedure. Follow these instructions at home: For at least 24 hours after the procedure:      Have a responsible adult stay with you. It is important to have someone help care for you until you are awake and alert.  Rest as needed.  Do not: ? Participate in activities in which you could fall or become injured. ? Drive. ? Use heavy machinery. ? Drink alcohol. ? Take sleeping pills or medicines that cause drowsiness. ? Make important decisions or sign legal documents. ? Take care of children on your own. Eating and drinking  Follow the diet that is recommended by your health care provider.  If you vomit, drink water, juice, or soup when you can drink without vomiting.  Make sure you have little or no nausea before eating solid foods. General instructions  Take over-the-counter and prescription medicines only as told by your health care provider.  If you have sleep apnea, surgery and certain medicines can increase  your risk for breathing problems. Follow instructions from your health care provider about wearing your sleep device: ? Anytime you are sleeping, including during daytime naps. ? While taking prescription pain medicines, sleeping medicines, or medicines that make you drowsy.  If you smoke, do not smoke without supervision.  Keep all follow-up visits as told by your health care provider. This is important. Contact a health care provider if:  You keep feeling nauseous or you keep vomiting.  You feel light-headed.  You develop a rash.  You have a fever. Get help right away if:  You have trouble breathing. Summary  For several hours after your procedure, you may feel sleepy and have poor judgment.  Have a responsible adult stay with you for at least 24 hours or until you are awake and alert. This information is not intended to replace advice given to you by your health care provider. Make sure you discuss any questions you have with your health care provider. Document Revised: 02/03/2018 Document Reviewed: 02/26/2016 Elsevier Patient Education  [place].

## 2020-06-28 ENCOUNTER — Ambulatory Visit (HOSPITAL_COMMUNITY)
Admission: RE | Admit: 2020-06-28 | Discharge: 2020-06-28 | Disposition: A | Discharge status: Home / Self Care | Payer: Medicare Other | Attending: Gastroenterology | Admitting: Gastroenterology

## 2020-06-28 ENCOUNTER — Encounter (HOSPITAL_COMMUNITY): Payer: Self-pay | Admitting: Gastroenterology

## 2020-06-28 ENCOUNTER — Encounter (HOSPITAL_COMMUNITY)
Admission: RE | Disposition: A | Discharge status: Home / Self Care | Payer: Self-pay | Source: Home / Self Care | Attending: Gastroenterology

## 2020-06-28 ENCOUNTER — Other Ambulatory Visit: Payer: Self-pay

## 2020-06-28 ENCOUNTER — Ambulatory Visit (HOSPITAL_COMMUNITY): Payer: Medicare Other | Admitting: Anesthesiology

## 2020-06-28 DIAGNOSIS — Z7984 Long term (current) use of oral hypoglycemic drugs: Secondary | ICD-10-CM | POA: Diagnosis not present

## 2020-06-28 DIAGNOSIS — K2100 Gastro-esophageal reflux disease with esophagitis, without bleeding: Secondary | ICD-10-CM | POA: Diagnosis not present

## 2020-06-28 DIAGNOSIS — K228 Other specified diseases of esophagus: Secondary | ICD-10-CM | POA: Diagnosis not present

## 2020-06-28 DIAGNOSIS — K8000 Calculus of gallbladder with acute cholecystitis without obstruction: Secondary | ICD-10-CM | POA: Diagnosis not present

## 2020-06-28 DIAGNOSIS — Z79899 Other long term (current) drug therapy: Secondary | ICD-10-CM | POA: Diagnosis not present

## 2020-06-28 DIAGNOSIS — E785 Hyperlipidemia, unspecified: Secondary | ICD-10-CM | POA: Diagnosis not present

## 2020-06-28 DIAGNOSIS — I10 Essential (primary) hypertension: Secondary | ICD-10-CM | POA: Insufficient documentation

## 2020-06-28 DIAGNOSIS — Z9114 Patient's other noncompliance with medication regimen: Secondary | ICD-10-CM | POA: Diagnosis not present

## 2020-06-28 DIAGNOSIS — Z7982 Long term (current) use of aspirin: Secondary | ICD-10-CM | POA: Diagnosis not present

## 2020-06-28 DIAGNOSIS — E119 Type 2 diabetes mellitus without complications: Secondary | ICD-10-CM | POA: Insufficient documentation

## 2020-06-28 DIAGNOSIS — G4733 Obstructive sleep apnea (adult) (pediatric): Secondary | ICD-10-CM | POA: Insufficient documentation

## 2020-06-28 DIAGNOSIS — R1310 Dysphagia, unspecified: Secondary | ICD-10-CM | POA: Diagnosis not present

## 2020-06-28 DIAGNOSIS — K200 Eosinophilic esophagitis: Secondary | ICD-10-CM | POA: Diagnosis not present

## 2020-06-28 DIAGNOSIS — K3189 Other diseases of stomach and duodenum: Secondary | ICD-10-CM | POA: Diagnosis not present

## 2020-06-28 HISTORY — PX: ESOPHAGOGASTRODUODENOSCOPY (EGD) WITH PROPOFOL: SHX5813

## 2020-06-28 HISTORY — PX: BIOPSY: SHX5522

## 2020-06-28 LAB — GLUCOSE, CAPILLARY: Glucose-Capillary: 156 mg/dL — ABNORMAL HIGH (ref 70–99)

## 2020-06-28 SURGERY — ESOPHAGOGASTRODUODENOSCOPY (EGD) WITH PROPOFOL
Anesthesia: General

## 2020-06-28 MED ORDER — GLYCOPYRROLATE 0.2 MG/ML IJ SOLN
0.2000 mg | Freq: Once | INTRAMUSCULAR | Status: AC
  Administered 2020-06-28: 0.2 mg via INTRAVENOUS

## 2020-06-28 MED ORDER — PROPOFOL 500 MG/50ML IV EMUL
INTRAVENOUS | Status: DC | PRN
  Administered 2020-06-28: 150 ug/kg/min via INTRAVENOUS

## 2020-06-28 MED ORDER — LIDOCAINE VISCOUS HCL 2 % MT SOLN
OROMUCOSAL | Status: AC
  Filled 2020-06-28: qty 15

## 2020-06-28 MED ORDER — LACTATED RINGERS IV SOLN: INTRAVENOUS | Status: DC

## 2020-06-28 MED ORDER — STERILE WATER FOR IRRIGATION IR SOLN
Status: DC | PRN
  Administered 2020-06-28: 1.5 mL

## 2020-06-28 MED ORDER — PROPOFOL 10 MG/ML IV BOLUS
INTRAVENOUS | Status: DC | PRN
  Administered 2020-06-28: 60 mg via INTRAVENOUS

## 2020-06-28 MED ORDER — PROPOFOL 10 MG/ML IV BOLUS
INTRAVENOUS | Status: AC
  Filled 2020-06-28: qty 40

## 2020-06-28 MED ORDER — LIDOCAINE VISCOUS HCL 2 % MT SOLN
15.0000 mL | Freq: Once | OROMUCOSAL | Status: AC
  Administered 2020-06-28: 15 mL via OROMUCOSAL

## 2020-06-28 MED ORDER — LACTATED RINGERS IV SOLN: INTRAVENOUS | Status: DC | PRN

## 2020-06-28 MED ORDER — OMEPRAZOLE 40 MG PO CPDR
40.0000 mg | DELAYED_RELEASE_CAPSULE | Freq: Two times a day (BID) | ORAL | 1 refills | Status: DC
Start: 2020-06-28 — End: 2020-08-29

## 2020-06-28 MED ORDER — GLYCOPYRROLATE 0.2 MG/ML IJ SOLN
INTRAMUSCULAR | Status: AC
  Filled 2020-06-28: qty 1

## 2020-06-28 NOTE — Anesthesia Preprocedure Evaluation (Signed)
Anesthesia Evaluation  Patient identified by MRN, date of birth, ID band Patient awake    Reviewed: Allergy & Precautions, H&P , NPO status , Patient's Chart, lab work & pertinent test results, reviewed documented beta blocker date and time   Airway Mallampati: II  TM Distance: >3 FB Neck ROM: full    Dental no notable dental hx. (+) Teeth Intact   Pulmonary neg pulmonary ROS,    Pulmonary exam normal breath sounds clear to auscultation       Cardiovascular Exercise Tolerance: Good negative cardio ROS   Rhythm:regular Rate:Normal     Neuro/Psych negative neurological ROS  negative psych ROS   GI/Hepatic Neg liver ROS, hiatal hernia,   Endo/Other  negative endocrine ROSdiabetes  Renal/GU negative Renal ROS  negative genitourinary   Musculoskeletal   Abdominal   Peds  Hematology negative hematology ROS (+)   Anesthesia Other Findings   Reproductive/Obstetrics negative OB ROS                             Anesthesia Physical Anesthesia Plan  ASA: II  Anesthesia Plan: General   Post-op Pain Management:    Induction:   PONV Risk Score and Plan: Propofol infusion  Airway Management Planned:   Additional Equipment:   Intra-op Plan:   Post-operative Plan:   Informed Consent: I have reviewed the patients History and Physical, chart, labs and discussed the procedure including the risks, benefits and alternatives for the proposed anesthesia with the patient or authorized representative who has indicated his/her understanding and acceptance.     Dental Advisory Given  Plan Discussed with: CRNA  Anesthesia Plan Comments:         Anesthesia Quick Evaluation

## 2020-06-28 NOTE — Anesthesia Postprocedure Evaluation (Signed)
Anesthesia Post Note  Patient: Micheal [name].  Procedure(s) Performed: ESOPHAGOGASTRODUODENOSCOPY (EGD) WITH PROPOFOL (N/A ) ESOPHAGEAL DILATION (N/A ) BIOPSY  Patient location during evaluation: PACU Anesthesia Type: General Level of consciousness: awake, oriented, awake and alert and patient cooperative Pain management: satisfactory to patient Vital Signs Assessment: post-procedure vital signs reviewed and stable Respiratory status: spontaneous breathing, respiratory function stable and nonlabored ventilation Cardiovascular status: stable Postop Assessment: no apparent nausea or vomiting Anesthetic complications: no   No complications documented.   Last Vitals:  Vitals:   06/28/20 1029  BP: 127/83  Pulse: 95  Resp: 17  Temp: 36.9 C  SpO2: 98%    Last Pain:  Vitals:   06/28/20 1156  TempSrc:   PainSc: 0-No pain                 Fleeta Kunde

## 2020-06-28 NOTE — Discharge Instructions (Signed)
You are being discharged to home.  Resume your previous diet.  Take Prilosec (omeprazole) 40 mg by mouth twice a day for three months.  We are waiting for your pathology results.  Your physician has recommended a repeat upper endoscopy for surveillance based on pathology results.  Follow up in GI clinic in 2 months.   Upper Endoscopy, Adult, Care After This sheet gives you information about how to care for yourself after your procedure. Your health care provider may also give you more specific instructions. If you have problems or questions, contact your health care provider. What can I expect after the procedure? After the procedure, it is common to have:  A sore throat.  Mild stomach pain or discomfort.  Bloating.  Nausea. Follow these instructions at home:   Follow instructions from your health care provider about what to eat or drink after your procedure.  Return to your normal activities as told by your health care provider. Ask your health care provider what activities are safe for you.  Take over-the-counter and prescription medicines only as told by your health care provider.  Do not drive for 24 hours if you were given a sedative during your procedure.  Keep all follow-up visits as told by your health care provider. This is important. Contact a health care provider if you have:  A sore throat that lasts longer than one day.  Trouble swallowing. Get help right away if:  You vomit blood or your vomit looks like coffee grounds.  You have: ? A fever. ? Bloody, black, or tarry stools. ? A severe sore throat or you cannot swallow. ? Difficulty breathing. ? Severe pain in your chest or abdomen. Summary  After the procedure, it is common to have a sore throat, mild stomach discomfort, bloating, and nausea.  Do not drive for 24 hours if you were given a sedative during the procedure.  Follow instructions from your health care provider about what to eat or drink  after your procedure.  Return to your normal activities as told by your health care provider. This information is not intended to replace advice given to you by your health care provider. Make sure you discuss any questions you have with your health care provider. Document Revised: 04/29/2018 Document Reviewed: 04/07/2018 Elsevier Patient Education  [place].   Gastroesophageal Reflux Disease, Adult Gastroesophageal reflux (GER) happens when acid from the stomach flows up into the tube that connects the mouth and the stomach (esophagus). Normally, food travels down the esophagus and stays in the stomach to be digested. However, when a person has GER, food and stomach acid sometimes move back up into the esophagus. If this becomes a more serious problem, the person may be diagnosed with a disease called gastroesophageal reflux disease (GERD). GERD occurs when the reflux:  Happens often.  Causes frequent or severe symptoms.  Causes problems such as damage to the esophagus. When stomach acid comes in contact with the esophagus, the acid may cause soreness (inflammation) in the esophagus. Over time, GERD may create small holes (ulcers) in the lining of the esophagus. What are the causes? This condition is caused by a problem with the muscle between the esophagus and the stomach (lower esophageal sphincter, or LES). Normally, the LES muscle closes after food passes through the esophagus to the stomach. When the LES is weakened or abnormal, it does not close properly, and that allows food and stomach acid to go back up into the esophagus. The LES can  be weakened by certain dietary substances, medicines, and medical conditions, including:  Tobacco use.  Pregnancy.  Having a hiatal hernia.  Alcohol use.  Certain foods and beverages, such as coffee, chocolate, onions, and peppermint. What increases the risk? You are more likely to develop this condition if you:  Have an increased  body weight.  Have a connective tissue disorder.  Use NSAID medicines. What are the signs or symptoms? Symptoms of this condition include:  Heartburn.  Difficult or painful swallowing.  The feeling of having a lump in the throat.  Abitter taste in the mouth.  Bad breath.  Having a large amount of saliva.  Having an upset or bloated stomach.  Belching.  Chest pain. Different conditions can cause chest pain. Make sure you see your health care provider if you experience chest pain.  Shortness of breath or wheezing.  Ongoing (chronic) cough or a night-time cough.  Wearing away of tooth enamel.  Weight loss. How is this diagnosed? Your health care provider will take a medical history and perform a physical exam. To determine if you have mild or severe GERD, your health care provider may also monitor how you respond to treatment. You may also have tests, including:  A test to examine your stomach and esophagus with a small camera (endoscopy).  A test thatmeasures the acidity level in your esophagus.  A test thatmeasures how much pressure is on your esophagus.  A barium swallow or modified barium swallow test to show the shape, size, and functioning of your esophagus. How is this treated? The goal of treatment is to help relieve your symptoms and to prevent complications. Treatment for this condition may vary depending on how severe your symptoms are. Your health care provider may recommend:  Changes to your diet.  Medicine.  Surgery. Follow these instructions at home: Eating and drinking   Follow a diet as recommended by your health care provider. This may involve avoiding foods and drinks such as: ? Coffee and tea (with or without caffeine). ? Drinks that containalcohol. ? Energy drinks and sports drinks. ? Carbonated drinks or sodas. ? Chocolate and cocoa. ? Peppermint and mint flavorings. ? Garlic and onions. ? Horseradish. ? Spicy and acidic foods,  including peppers, chili powder, curry powder, vinegar, hot sauces, and barbecue sauce. ? Citrus fruit juices and citrus fruits, such as oranges, lemons, and limes. ? Tomato-based foods, such as red sauce, chili, salsa, and pizza with red sauce. ? Fried and fatty foods, such as donuts, french fries, potato chips, and high-fat dressings. ? High-fat meats, such as hot dogs and fatty cuts of red and white meats, such as rib eye steak, sausage, ham, and bacon. ? High-fat dairy items, such as whole milk, butter, and cream cheese.  Eat small, frequent meals instead of large meals.  Avoid drinking large amounts of liquid with your meals.  Avoid eating meals during the 2-3 hours before bedtime.  Avoid lying down right after you eat.  Do not exercise right after you eat. Lifestyle   Do not use any products that contain nicotine or tobacco, such as cigarettes, e-cigarettes, and chewing tobacco. If you need help quitting, ask your health care provider.  Try to reduce your stress by using methods such as yoga or meditation. If you need help reducing stress, ask your health care provider.  If you are overweight, reduce your weight to an amount that is healthy for you. Ask your health care provider for guidance about a safe weight  loss goal. General instructions  Pay attention to any changes in your symptoms.  Take over-the-counter and prescription medicines only as told by your health care provider. Do not take aspirin, ibuprofen, or other NSAIDs unless your health care provider told you to do so.  Wear loose-fitting clothing. Do not wear anything tight around your waist that causes pressure on your abdomen.  Raise (elevate) the head of your bed about 6 inches (15 cm).  Avoid bending over if this makes your symptoms worse.  Keep all follow-up visits as told by your health care provider. This is important. Contact a health care provider if:  You have: ? New symptoms. ? Unexplained weight  loss. ? Difficulty swallowing or it hurts to swallow. ? Wheezing or a persistent cough. ? A hoarse voice.  Your symptoms do not improve with treatment. Get help right away if you:  Have pain in your arms, neck, jaw, teeth, or back.  Feel sweaty, dizzy, or light-headed.  Have chest pain or shortness of breath.  Vomit and your vomit looks like blood or coffee grounds.  Faint.  Have stool that is bloody or black.  Cannot swallow, drink, or eat. Summary  Gastroesophageal reflux happens when acid from the stomach flows up into the esophagus. GERD is a disease in which the reflux happens often, causes frequent or severe symptoms, or causes problems such as damage to the esophagus.  Treatment for this condition may vary depending on how severe your symptoms are. Your health care provider may recommend diet and lifestyle changes, medicine, or surgery.  Contact a health care provider if you have new or worsening symptoms.  Take over-the-counter and prescription medicines only as told by your health care provider. Do not take aspirin, ibuprofen, or other NSAIDs unless your health care provider told you to do so.  Keep all follow-up visits as told by your health care provider. This is important. This information is not intended to replace advice given to you by your health care provider. Make sure you discuss any questions you have with your health care provider. Document Revised: 05/14/2018 Document Reviewed: 05/14/2018 Elsevier Patient Education  [place].

## 2020-06-28 NOTE — H&P (Signed)
Micheal [name]. is an 69 y.o. male.   Chief Complaint: dysphagia, h.o EoE HPI: 69 y.o. male with past medical history of diabetes, hyperlipidemia, hypertension, OSA, eosinophilic esophagitis who presents for evaluation of recurrent dysphagia.  Patient has presented worsening dysphagia to solids for the last 5 months with weight loss of 15 pounds due to medication poor compliance.  He denies any episodes of food impaction or odynophagia.  He is not taking any medication at the moment.  Last EGD was performed in 2015 which showed friable mucosa and excoriations, with biopsies consistent with EOE.  He initially responded to topical steroid trial.  Past Medical History:  Diagnosis Date  . Diabetes mellitus    NIDDM  . History of kidney stones   . Hyperlipidemia   . Kidney stones   . Seasonal allergies   . Sleep apnea sleep study 12/08/2007   no CPAP use; uses a mouth guard at night  . Umbilical hernia     Past Surgical History:  Procedure Laterality Date  . COLONOSCOPY  10/28/2006; 10/23/2011  . COLONOSCOPY  10/23/2011   SLF:  1). Internal hemorroids, small  2). POLYPOID LESION (AC) . next TCS 10/2016  . COLONOSCOPY N/A 03/20/2017   Procedure: COLONOSCOPY;  Surgeon: Rogene Houston, MD;  [facility];  Service: Endoscopy;  Laterality: N/A;  1030-rescheduled 03/20/17 per Lelon Frohlich - pt to arrive at 10:00  . CYSTOSCOPY W/ URETERAL STENT PLACEMENT  04/12/2011   right retrograde; basketing of stone  . ESOPHAGOGASTRODUODENOSCOPY N/A 10/12/2014   RMR:  abnormal esophagus as described above query EOE status post biopsy; food impaction passes spontaneously with intubation of the esophagus c/w EOE, probable noncritical stricture at GEJ with possible ring component. food bolus had passed.   . ESOPHAGOGASTRODUODENOSCOPY N/A 11/16/2014   Procedure: ESOPHAGOGASTRODUODENOSCOPY (EGD);  Surgeon: Daneil Dolin, MD;  [facility];  Service: Endoscopy;  Laterality: N/A;  0830  . [place] N/A 11/16/2014   Procedure: Venia Minks DILATION;  Surgeon: Daneil Dolin, MD;  [facility];  Service: Endoscopy;  Laterality: N/A;  . SAVORY DILATION N/A 11/16/2014   Procedure: SAVORY DILATION;  Surgeon: Daneil Dolin, MD;  [facility];  Service: Endoscopy;  Laterality: N/A;  . UMBILICAL HERNIA REPAIR  10/26/2011   Procedure: HERNIA REPAIR UMBILICAL ADULT;  Surgeon: Rolm Bookbinder, MD;  Location: [facility];  Service: General;  Laterality: N/A;  umbilical hernia repair with mesh    Family History  Problem Relation Age of Onset  . Cancer Mother        colon, age 73 deceased  . Stroke Father   . Diabetes Brother    Social History:  reports that he has never smoked. He has never used smokeless tobacco. He reports current alcohol use of about 1.0 - 3.0 standard drink of alcohol per week. He reports that he does not use drugs.  Allergies: No Known Allergies  Medications Prior to Admission  Medication Sig Dispense Refill  . aspirin EC 81 MG tablet Take 81 mg by mouth daily.     Marland Kitchen ezetimibe (ZETIA) 10 MG tablet Take 10 mg by mouth daily.    Marland Kitchen glimepiride (AMARYL) 2 MG tablet Take 2 mg by mouth daily with breakfast.     . magnesium oxide (MAG-OX) 400 MG tablet Take 400 mg by mouth daily.    . metFORMIN (GLUCOPHAGE-XR) 500 MG 24 hr tablet Take 2,000 mg  by mouth daily with breakfast.     . pioglitazone (ACTOS) 15 MG tablet Take 15 mg by mouth daily.    Marland Kitchen VICTOZA 18 MG/3ML SOPN Inject 1.2 mg into the skin daily.    Marland Kitchen ibuprofen (ADVIL,MOTRIN) 800 MG tablet Take 1 tablet (800 mg total) by mouth 3 (three) times daily. (Patient not taking: Reported on 06/24/2020) 21 tablet 0  . lisinopril (PRINIVIL,ZESTRIL) 20 MG tablet Take 20 mg by mouth daily. (Patient not taking: Reported on 06/23/2020)      Results for orders placed or performed during the hospital encounter of 06/28/20 (from the past 48 hour(s))  Glucose, capillary     Status: Abnormal    Collection Time: 06/28/20 10:33 AM  Result Value Ref Range   Glucose-Capillary 156 (H) 70 - 99 mg/dL    Comment: Glucose reference range applies only to samples taken after fasting for at least 8 hours.   No results found.  Review of Systems  Constitutional: Negative.   HENT: Positive for trouble swallowing.   Eyes: Negative.   Respiratory: Negative.   Cardiovascular: Negative.   Gastrointestinal: Negative.   Endocrine: Negative.   Genitourinary: Negative.   Musculoskeletal: Negative.   Skin: Negative.   Allergic/Immunologic: Negative.   Neurological: Negative.   Hematological: Negative.   Psychiatric/Behavioral: Negative.     Blood pressure 127/83, pulse 95, temperature 98.5 F (36.9 C), temperature source Oral, resp. rate 17, height 6\' 2"  (1.88 m), weight 127.5 kg, SpO2 98 %. Physical Exam  GENERAL: The patient is AO x3, in no acute distress. Obese. HEENT: Head is normocephalic and atraumatic. EOMI are intact. Mouth is well hydrated and without lesions. NECK: Supple. No masses LUNGS: Clear to auscultation. No presence of rhonchi/wheezing/rales. Adequate chest expansion HEART: RRR, normal s1 and s2. ABDOMEN: Soft, nontender, no guarding, no peritoneal signs, and nondistended. BS +. No masses. EXTREMITIES: Without any cyanosis, clubbing, rash, lesions or edema. NEUROLOGIC: AOx3, no focal motor deficit. SKIN: no jaundice, no rashes  Assessment/Plan 69 y.o. male with past medical history of diabetes, hyperlipidemia, hypertension, OSA, eosinophilic esophagitis who presents for evaluation of recurrent dysphagia.  We will proceed with EGD with possible dilation based on findings.  Harvel Quale, MD 06/28/2020, 11:51 AM

## 2020-06-28 NOTE — Op Note (Signed)
[facility] Patient Name: Micheal [name] Procedure Date: 06/28/2020 11:13 AM MRN: [id number] Date of Birth: 20-Nov-1950 Attending MD: [name] ,  CSN: [id number] Age: 69 Admit Type: Outpatient Procedure:                Upper GI endoscopy Indications:              Dysphagia Providers:                [name], Charlsie Quest. Theda Sers RN, RN, Crystal                            Page, Randa Spike, Technician Referring MD:              Medicines:                Monitored Anesthesia Care Complications:            No immediate complications. Estimated Blood Loss:     Estimated blood loss: none. Procedure:                Pre-Anesthesia Assessment:                           - Prior to the procedure, a History and Physical                            was performed, and patient medications, allergies                            and sensitivities were reviewed. The patient's                            tolerance of previous anesthesia was reviewed.                           - The risks and benefits of the procedure and the                            sedation options and risks were discussed with the                            patient. All questions were answered and informed                            consent was obtained.                           - ASA Grade Assessment: II - A patient with mild                            systemic disease.                           After obtaining informed consent, the endoscope was                            passed under direct vision. Throughout the  procedure, the patient's blood pressure, pulse, and                            oxygen saturations were monitored continuously. The                            GIF-H190 ([id number]) scope was introduced through the                            mouth, and advanced to the second part of duodenum.                            The upper GI endoscopy was accomplished without                             difficulty. The patient tolerated the procedure                            well. Scope In: 11:58:49 AM Scope Out: 12:12:08 PM Total Procedure Duration: 0 hours 13 minutes 19 seconds  Findings:      LA Grade B (one or more mucosal breaks greater than 5 mm, not extending       between the tops of two mucosal folds) esophagitis with no bleeding was       found 41 cm from the incisors.      The examined esophagus was otherwise normal, with no presence of       inflammation or other alterations in the mucosal lining. Biopsies were       obtained from the mid and distal esophagus with cold forceps for       histology of suspected eosinophilic esophagitis.      The entire examined stomach was normal.      Diffuse scalloped mucosa was found in the second portion of the       duodenum. Biopsies for histology were taken with a cold forceps for       evaluation of celiac disease. Impression:               - LA Grade B reflux esophagitis with no bleeding.                           - Normal esophagus. Biopsied.                           - Normal stomach.                           - Scalloped mucosa was found in the duodenum,                            suspicious for celiac disease. Biopsied. Moderate Sedation:      Per Anesthesia Care Recommendation:           - Discharge patient to home (ambulatory).                           - Resume previous diet.                           -  Use Prilosec (omeprazole) 40 mg PO BID for 3                            months.                           - Follow up in GI clinic in 2 months                           - Await pathology results.                           - Repeat upper endoscopy for surveillance based on                            pathology results. Procedure Code(s):        --- Professional ---                           [id number], GC, Esophagogastroduodenoscopy, flexible,                            transoral; with biopsy, single or  multiple Diagnosis Code(s):        --- Professional ---                           K21.00, Gastro-esophageal reflux disease with                            esophagitis, without bleeding                           K31.89, Other diseases of stomach and duodenum                           R13.10, Dysphagia, unspecified CPT copyright 2019 American Medical Association. All rights reserved. The codes documented in this report are preliminary and upon coder review may  be revised to meet current compliance requirements. [name], MD [name],  06/28/2020 12:24:01 PM This report has been signed electronically. Number of Addenda: 0

## 2020-06-28 NOTE — Transfer of Care (Signed)
Immediate Anesthesia Transfer of Care Note  Patient: Micheal [name].  Procedure(s) Performed: ESOPHAGOGASTRODUODENOSCOPY (EGD) WITH PROPOFOL (N/A ) ESOPHAGEAL DILATION (N/A ) BIOPSY  Patient Location: PACU  Anesthesia Type:General  Level of Consciousness: awake, alert , oriented and patient cooperative  Airway & Oxygen Therapy: Patient Spontanous Breathing  Post-op Assessment: Report given to RN, Post -op Vital signs reviewed and stable and Patient moving all extremities X 4  Post vital signs: Reviewed and stable  Last Vitals:  Vitals Value Taken Time  BP    Temp    Pulse    Resp    SpO2      Last Pain:  Vitals:   06/28/20 1156  TempSrc:   PainSc: 0-No pain      Patients Stated Pain Goal: 7 (15/18/34 3735)  Complications: No complications documented.

## 2020-06-29 LAB — SURGICAL PATHOLOGY

## 2020-07-01 ENCOUNTER — Encounter (HOSPITAL_COMMUNITY): Payer: Self-pay | Admitting: Gastroenterology

## 2020-07-04 ENCOUNTER — Ambulatory Visit (INDEPENDENT_AMBULATORY_CARE_PROVIDER_SITE_OTHER): Payer: Medicare Other | Admitting: Gastroenterology

## 2020-07-07 ENCOUNTER — Ambulatory Visit (INDEPENDENT_AMBULATORY_CARE_PROVIDER_SITE_OTHER): Payer: Medicare Other | Admitting: Gastroenterology

## 2020-08-29 ENCOUNTER — Other Ambulatory Visit: Payer: Self-pay

## 2020-08-29 ENCOUNTER — Encounter (INDEPENDENT_AMBULATORY_CARE_PROVIDER_SITE_OTHER): Payer: Self-pay | Admitting: Gastroenterology

## 2020-08-29 ENCOUNTER — Ambulatory Visit (INDEPENDENT_AMBULATORY_CARE_PROVIDER_SITE_OTHER): Payer: Medicare Other | Admitting: Gastroenterology

## 2020-08-29 DIAGNOSIS — K2100 Gastro-esophageal reflux disease with esophagitis, without bleeding: Secondary | ICD-10-CM

## 2020-08-29 DIAGNOSIS — K219 Gastro-esophageal reflux disease without esophagitis: Secondary | ICD-10-CM | POA: Insufficient documentation

## 2020-08-29 MED ORDER — OMEPRAZOLE 40 MG PO CPDR: 40.0000 mg | DELAYED_RELEASE_CAPSULE | Freq: Every day | ORAL | 4 refills | Status: DC

## 2020-08-29 NOTE — Progress Notes (Signed)
Micheal [name], M.D. Gastroenterology & Hepatology [facility] [address] [place], [place] [address]  Primary Care Physician: Asencion Noble, MD [address] [place] [address]  I will communicate my assessment and recommendations to the referring MD via EMR. "Note: Occasional unusual wording and randomly placed punctuation marks may result from the use of speech recognition technology to transcribe this document"  Problems: 1. Grade B esophagitis 2. GERD  History of Present Illness: Micheal Tant. is a 69 y.o. male with past medical history of diabetes, hyperlipidemia, hypertension, OSA and GERD, who presents for follow-up of dysphagia due to esophagitis.  The patient was last seen on 06/23/2020.  The patient was scheduled for an EGD, he had this performed on 06/28/2020.  EGD showed presence of grade B esophagitis.  Biopsies from the mid and distal esophagus were taken which were positive for changes consistent with reflux but negative for eosinophils.  There was also a scalloped mucosa in his duodenum but this was negative for celiac disease.  He was started on Prilosec 40 mg twice daily.  The patient states that since he started taking his medications he has not presented more episodes of dysphagia.  He reports taking compliantly he is Prilosec twice a day.  Denies having any complaints at the moment.  Has not presented any odynophagia. The patient denies having any nausea, vomiting, fever, chills, hematochezia, melena, hematemesis, abdominal distention, abdominal pain, diarrhea, jaundice, pruritus or unintentional weight loss.  Past Medical History: Past Medical History:  Diagnosis Date  . Diabetes mellitus    NIDDM  . History of kidney stones   . Hyperlipidemia   . Kidney stones   . Seasonal allergies   . Sleep apnea sleep study 12/08/2007   no CPAP use; uses a mouth guard at night  . Umbilical hernia     Past  Surgical History: Past Surgical History:  Procedure Laterality Date  . BIOPSY  06/28/2020   Procedure: BIOPSY;  Surgeon: Harvel Quale, MD;  [facility];  Service: Gastroenterology;;  . COLONOSCOPY  10/28/2006; 10/23/2011  . COLONOSCOPY  10/23/2011   SLF:  1). Internal hemorroids, small  2). POLYPOID LESION (AC) . next TCS 10/2016  . COLONOSCOPY N/A 03/20/2017   Procedure: COLONOSCOPY;  Surgeon: Rogene Houston, MD;  [facility];  Service: Endoscopy;  Laterality: N/A;  1030-rescheduled 03/20/17 per Lelon Frohlich - pt to arrive at 10:00  . CYSTOSCOPY W/ URETERAL STENT PLACEMENT  04/12/2011   right retrograde; basketing of stone  . ESOPHAGOGASTRODUODENOSCOPY N/A 10/12/2014   RMR:  abnormal esophagus as described above query EOE status post biopsy; food impaction passes spontaneously with intubation of the esophagus c/w EOE, probable noncritical stricture at GEJ with possible ring component. food bolus had passed.   . ESOPHAGOGASTRODUODENOSCOPY N/A 11/16/2014   Procedure: ESOPHAGOGASTRODUODENOSCOPY (EGD);  Surgeon: Daneil Dolin, MD;  [facility];  Service: Endoscopy;  Laterality: N/A;  0830  . ESOPHAGOGASTRODUODENOSCOPY (EGD) WITH PROPOFOL N/A 06/28/2020   Procedure: ESOPHAGOGASTRODUODENOSCOPY (EGD) WITH PROPOFOL;  Surgeon: Harvel Quale, MD;  [facility];  Service: Gastroenterology;  Laterality: N/A;  1130  . [place] N/A 11/16/2014   Procedure: Venia Minks DILATION;  Surgeon: Daneil Dolin, MD;  [facility];  Service: Endoscopy;  Laterality: N/A;  . SAVORY DILATION N/A 11/16/2014   Procedure: SAVORY DILATION;  Surgeon: Daneil Dolin, MD;  [facility];  Service: Endoscopy;  Laterality: N/A;  . UMBILICAL HERNIA REPAIR  10/26/2011   Procedure: HERNIA REPAIR UMBILICAL ADULT;  Surgeon: Rolm Bookbinder, MD;  Location: [place];  Service: General;  Laterality: N/A;  umbilical  hernia repair with mesh    Family History: Family History  Problem Relation Age of Onset  . Cancer Mother        colon, age 27 deceased  . Stroke Father   . Diabetes Brother     Social History: Social History   Tobacco Use  Smoking Status Never Smoker  Smokeless Tobacco Never Used   Social History   Substance and Sexual Activity  Alcohol Use Yes  . Alcohol/week: 1.0 - 3.0 standard drink  . Types: 1 - 3 Cans of beer per week   Comment: occasionally   Social History   Substance and Sexual Activity  Drug Use No    Allergies: No Known Allergies  Medications: Current Outpatient Medications  Medication Sig Dispense Refill  . aspirin EC 81 MG tablet Take 81 mg by mouth daily.     Marland Kitchen ezetimibe (ZETIA) 10 MG tablet Take 10 mg by mouth daily.    Marland Kitchen glimepiride (AMARYL) 2 MG tablet Take 2 mg by mouth daily with breakfast.     . lisinopril (PRINIVIL,ZESTRIL) 20 MG tablet Take 20 mg by mouth daily. (Patient not taking: Reported on 06/23/2020)    . magnesium oxide (MAG-OX) 400 MG tablet Take 400 mg by mouth daily.    . metFORMIN (GLUCOPHAGE-XR) 500 MG 24 hr tablet Take 2,000 mg by mouth daily with breakfast.     . omeprazole (PRILOSEC) 40 MG capsule Take 1 capsule (40 mg total) by mouth in the morning and at bedtime. 90 capsule 1  . pioglitazone (ACTOS) 15 MG tablet Take 15 mg by mouth daily.    Marland Kitchen VICTOZA 18 MG/3ML SOPN Inject 1.2 mg into the skin daily.     No current facility-administered medications for this visit.    Review of Systems: GENERAL: negative for malaise, night sweats HEENT: No changes in hearing or vision, no nose bleeds or other nasal problems. NECK: Negative for lumps, goiter, pain and significant neck swelling RESPIRATORY: Negative for cough, wheezing CARDIOVASCULAR: Negative for chest pain, leg swelling, palpitations, orthopnea GI: SEE HPI MUSCULOSKELETAL: Negative for joint pain or swelling, back pain, and muscle pain. SKIN: Negative for lesions,  rash PSYCH: Negative for sleep disturbance, mood disorder and recent psychosocial stressors. HEMATOLOGY Negative for prolonged bleeding, bruising easily, and swollen nodes. ENDOCRINE: Negative for cold or heat intolerance, polyuria, polydipsia and goiter. NEURO: negative for tremor, gait imbalance, syncope and seizures. The remainder of the review of systems is noncontributory.   Physical Exam: BP (!) 153/93 (BP Location: Right Arm, Patient Position: Sitting, Cuff Size: Normal)   Pulse 76   Temp 99.1 F (37.3 C) (Oral)   Ht 6\' 2"  (1.88 m)   Wt 295 lb 14.4 oz (134.2 kg)   BMI 37.99 kg/m  GENERAL: The patient is AO x3, in no acute distress.  Obese. HEENT: Head is normocephalic and atraumatic. EOMI are intact. Mouth is well hydrated and without lesions. NECK: Supple. No masses LUNGS: Clear to auscultation. No presence of rhonchi/wheezing/rales. Adequate chest expansion HEART: RRR, normal s1 and s2. ABDOMEN: Soft, nontender, no guarding, no peritoneal signs, and nondistended. BS +. No masses. EXTREMITIES: Without any cyanosis, clubbing, rash, lesions or edema. NEUROLOGIC: AOx3, no focal motor deficit. SKIN: no jaundice, no rashes  Imaging/Labs: as above  I personally reviewed and interpreted the available labs, imaging and endoscopic files.  Impression and Plan: Micheal [name]. is a 69 y.o. male with past medical history of diabetes, hyperlipidemia, hypertension, OSA and GERD, who presents for follow-up of dysphagia due to esophagitis.  The patient was found to have endoscopic evidence of reflux esophagitis which has adequately responded to PPI therapy twice a day.  He denies having any complaints and has tolerated medication adequately.  At this point, he will benefit from finishing a 76-month course of twice daily PPI and after that he can decrease to once daily dosing.  He will need to follow-up in the clinic in 1 year, after which we will consider lowering his dose to 20 mg if  symptoms are well controlled.  Overall, his histological findings are not consistent with eosinophilic esophagitis which was an incorrect diagnosis in the past.  - Continue omeprazole 40 mg twice a day for one more month, then decrease to 40 mg indefinitely - Explained presumed etiology of reflux symptoms. Instruction provided in the use of antireflux medication - patient should take medication in the morning 30-45 minutes before eating breakfast. Discussed avoidance of eating within 2 hours of lying down to sleep and benefit of blocks to elevate head of bed. -Return to clinic in 1 year  All questions were answered.      Harvel Quale, MD Gastroenterology and Hepatology [facility]

## 2020-08-29 NOTE — Patient Instructions (Signed)
Continue omeprazole 40 mg twice a day for one more month, then decrease to 40 mg until next follow up

## 2020-10-31 DIAGNOSIS — N401 Enlarged prostate with lower urinary tract symptoms: Secondary | ICD-10-CM | POA: Diagnosis not present

## 2020-10-31 DIAGNOSIS — N200 Calculus of kidney: Secondary | ICD-10-CM | POA: Diagnosis not present

## 2020-10-31 DIAGNOSIS — R972 Elevated prostate specific antigen [PSA]: Secondary | ICD-10-CM | POA: Diagnosis not present

## 2021-01-16 DIAGNOSIS — E119 Type 2 diabetes mellitus without complications: Secondary | ICD-10-CM | POA: Diagnosis not present

## 2021-01-16 DIAGNOSIS — E785 Hyperlipidemia, unspecified: Secondary | ICD-10-CM | POA: Diagnosis not present

## 2021-01-16 DIAGNOSIS — I10 Essential (primary) hypertension: Secondary | ICD-10-CM | POA: Diagnosis not present

## 2021-01-31 DIAGNOSIS — I10 Essential (primary) hypertension: Secondary | ICD-10-CM | POA: Diagnosis not present

## 2021-01-31 DIAGNOSIS — G4730 Sleep apnea, unspecified: Secondary | ICD-10-CM | POA: Diagnosis not present

## 2021-01-31 DIAGNOSIS — Z79899 Other long term (current) drug therapy: Secondary | ICD-10-CM | POA: Diagnosis not present

## 2021-01-31 DIAGNOSIS — E1129 Type 2 diabetes mellitus with other diabetic kidney complication: Secondary | ICD-10-CM | POA: Diagnosis not present

## 2021-01-31 DIAGNOSIS — R972 Elevated prostate specific antigen [PSA]: Secondary | ICD-10-CM | POA: Diagnosis not present

## 2021-01-31 DIAGNOSIS — E785 Hyperlipidemia, unspecified: Secondary | ICD-10-CM | POA: Diagnosis not present

## 2021-01-31 DIAGNOSIS — E669 Obesity, unspecified: Secondary | ICD-10-CM | POA: Diagnosis not present

## 2021-01-31 DIAGNOSIS — Z125 Encounter for screening for malignant neoplasm of prostate: Secondary | ICD-10-CM | POA: Diagnosis not present

## 2021-02-07 DIAGNOSIS — E1122 Type 2 diabetes mellitus with diabetic chronic kidney disease: Secondary | ICD-10-CM | POA: Diagnosis not present

## 2021-02-07 DIAGNOSIS — Z0000 Encounter for general adult medical examination without abnormal findings: Secondary | ICD-10-CM | POA: Diagnosis not present

## 2021-02-07 DIAGNOSIS — Z6839 Body mass index (BMI) 39.0-39.9, adult: Secondary | ICD-10-CM | POA: Diagnosis not present

## 2021-02-07 DIAGNOSIS — G720 Drug-induced myopathy: Secondary | ICD-10-CM | POA: Diagnosis not present

## 2021-02-07 DIAGNOSIS — R7401 Elevation of levels of liver transaminase levels: Secondary | ICD-10-CM | POA: Diagnosis not present

## 2021-02-07 DIAGNOSIS — R7309 Other abnormal glucose: Secondary | ICD-10-CM | POA: Diagnosis not present

## 2021-02-07 DIAGNOSIS — G4733 Obstructive sleep apnea (adult) (pediatric): Secondary | ICD-10-CM | POA: Diagnosis not present

## 2021-02-07 DIAGNOSIS — I10 Essential (primary) hypertension: Secondary | ICD-10-CM | POA: Diagnosis not present

## 2021-02-07 DIAGNOSIS — E785 Hyperlipidemia, unspecified: Secondary | ICD-10-CM | POA: Diagnosis not present

## 2021-03-18 DIAGNOSIS — E785 Hyperlipidemia, unspecified: Secondary | ICD-10-CM | POA: Diagnosis not present

## 2021-03-18 DIAGNOSIS — I10 Essential (primary) hypertension: Secondary | ICD-10-CM | POA: Diagnosis not present

## 2021-03-18 DIAGNOSIS — E119 Type 2 diabetes mellitus without complications: Secondary | ICD-10-CM | POA: Diagnosis not present

## 2021-03-28 DIAGNOSIS — E119 Type 2 diabetes mellitus without complications: Secondary | ICD-10-CM | POA: Diagnosis not present

## 2021-04-14 ENCOUNTER — Other Ambulatory Visit (INDEPENDENT_AMBULATORY_CARE_PROVIDER_SITE_OTHER): Payer: Self-pay | Admitting: *Deleted

## 2021-04-14 DIAGNOSIS — K200 Eosinophilic esophagitis: Secondary | ICD-10-CM

## 2021-04-14 DIAGNOSIS — K2100 Gastro-esophageal reflux disease with esophagitis, without bleeding: Secondary | ICD-10-CM

## 2021-04-14 DIAGNOSIS — R1310 Dysphagia, unspecified: Secondary | ICD-10-CM

## 2021-04-14 MED ORDER — OMEPRAZOLE 40 MG PO CPDR: 40.0000 mg | DELAYED_RELEASE_CAPSULE | Freq: Every day | ORAL | 3 refills | Status: DC

## 2021-04-14 NOTE — Telephone Encounter (Signed)
Patient's Omeprazole 40 mg was sent to the [facility] as we received a request from them on the patient's behalf. Patient was called and made aware, (message left), if the patient is not in agreement I told him to let us know and we would send it back to [place].

## 2021-04-18 DIAGNOSIS — E119 Type 2 diabetes mellitus without complications: Secondary | ICD-10-CM | POA: Diagnosis not present

## 2021-04-18 DIAGNOSIS — I10 Essential (primary) hypertension: Secondary | ICD-10-CM | POA: Diagnosis not present

## 2021-04-18 DIAGNOSIS — E785 Hyperlipidemia, unspecified: Secondary | ICD-10-CM | POA: Diagnosis not present

## 2021-05-04 DIAGNOSIS — E785 Hyperlipidemia, unspecified: Secondary | ICD-10-CM | POA: Diagnosis not present

## 2021-05-04 DIAGNOSIS — Z79899 Other long term (current) drug therapy: Secondary | ICD-10-CM | POA: Diagnosis not present

## 2021-05-04 DIAGNOSIS — I10 Essential (primary) hypertension: Secondary | ICD-10-CM | POA: Diagnosis not present

## 2021-05-04 DIAGNOSIS — R945 Abnormal results of liver function studies: Secondary | ICD-10-CM | POA: Diagnosis not present

## 2021-05-04 DIAGNOSIS — E1129 Type 2 diabetes mellitus with other diabetic kidney complication: Secondary | ICD-10-CM | POA: Diagnosis not present

## 2021-05-10 DIAGNOSIS — R7309 Other abnormal glucose: Secondary | ICD-10-CM | POA: Diagnosis not present

## 2021-05-10 DIAGNOSIS — E1122 Type 2 diabetes mellitus with diabetic chronic kidney disease: Secondary | ICD-10-CM | POA: Diagnosis not present

## 2021-05-10 DIAGNOSIS — R7401 Elevation of levels of liver transaminase levels: Secondary | ICD-10-CM | POA: Diagnosis not present

## 2021-05-10 DIAGNOSIS — I10 Essential (primary) hypertension: Secondary | ICD-10-CM | POA: Diagnosis not present

## 2021-05-18 DIAGNOSIS — E785 Hyperlipidemia, unspecified: Secondary | ICD-10-CM | POA: Diagnosis not present

## 2021-05-18 DIAGNOSIS — E119 Type 2 diabetes mellitus without complications: Secondary | ICD-10-CM | POA: Diagnosis not present

## 2021-05-18 DIAGNOSIS — I10 Essential (primary) hypertension: Secondary | ICD-10-CM | POA: Diagnosis not present

## 2021-05-19 DIAGNOSIS — N200 Calculus of kidney: Secondary | ICD-10-CM | POA: Diagnosis not present

## 2021-05-19 DIAGNOSIS — R300 Dysuria: Secondary | ICD-10-CM | POA: Diagnosis not present

## 2021-05-19 DIAGNOSIS — N401 Enlarged prostate with lower urinary tract symptoms: Secondary | ICD-10-CM | POA: Diagnosis not present

## 2021-05-19 DIAGNOSIS — N3941 Urge incontinence: Secondary | ICD-10-CM | POA: Diagnosis not present

## 2021-05-19 DIAGNOSIS — R972 Elevated prostate specific antigen [PSA]: Secondary | ICD-10-CM | POA: Diagnosis not present

## 2021-06-18 DIAGNOSIS — E785 Hyperlipidemia, unspecified: Secondary | ICD-10-CM | POA: Diagnosis not present

## 2021-06-18 DIAGNOSIS — E119 Type 2 diabetes mellitus without complications: Secondary | ICD-10-CM | POA: Diagnosis not present

## 2021-06-18 DIAGNOSIS — I10 Essential (primary) hypertension: Secondary | ICD-10-CM | POA: Diagnosis not present

## 2021-07-19 DIAGNOSIS — E119 Type 2 diabetes mellitus without complications: Secondary | ICD-10-CM | POA: Diagnosis not present

## 2021-07-19 DIAGNOSIS — E785 Hyperlipidemia, unspecified: Secondary | ICD-10-CM | POA: Diagnosis not present

## 2021-07-19 DIAGNOSIS — I10 Essential (primary) hypertension: Secondary | ICD-10-CM | POA: Diagnosis not present

## 2021-07-20 DIAGNOSIS — R972 Elevated prostate specific antigen [PSA]: Secondary | ICD-10-CM | POA: Diagnosis not present

## 2021-07-20 DIAGNOSIS — N401 Enlarged prostate with lower urinary tract symptoms: Secondary | ICD-10-CM | POA: Diagnosis not present

## 2021-07-20 DIAGNOSIS — N200 Calculus of kidney: Secondary | ICD-10-CM | POA: Diagnosis not present

## 2021-07-20 DIAGNOSIS — N3941 Urge incontinence: Secondary | ICD-10-CM | POA: Diagnosis not present

## 2021-08-02 DIAGNOSIS — H00022 Hordeolum internum right lower eyelid: Secondary | ICD-10-CM | POA: Diagnosis not present

## 2021-08-18 DIAGNOSIS — E1129 Type 2 diabetes mellitus with other diabetic kidney complication: Secondary | ICD-10-CM | POA: Diagnosis not present

## 2021-08-18 DIAGNOSIS — E119 Type 2 diabetes mellitus without complications: Secondary | ICD-10-CM | POA: Diagnosis not present

## 2021-08-18 DIAGNOSIS — R945 Abnormal results of liver function studies: Secondary | ICD-10-CM | POA: Diagnosis not present

## 2021-08-18 DIAGNOSIS — I10 Essential (primary) hypertension: Secondary | ICD-10-CM | POA: Diagnosis not present

## 2021-08-18 DIAGNOSIS — E785 Hyperlipidemia, unspecified: Secondary | ICD-10-CM | POA: Diagnosis not present

## 2021-08-22 DIAGNOSIS — R7309 Other abnormal glucose: Secondary | ICD-10-CM | POA: Diagnosis not present

## 2021-08-22 DIAGNOSIS — E119 Type 2 diabetes mellitus without complications: Secondary | ICD-10-CM | POA: Diagnosis not present

## 2021-08-22 DIAGNOSIS — L089 Local infection of the skin and subcutaneous tissue, unspecified: Secondary | ICD-10-CM | POA: Diagnosis not present

## 2021-08-22 DIAGNOSIS — Z23 Encounter for immunization: Secondary | ICD-10-CM | POA: Diagnosis not present

## 2021-08-22 DIAGNOSIS — I10 Essential (primary) hypertension: Secondary | ICD-10-CM | POA: Diagnosis not present

## 2021-08-26 DIAGNOSIS — L03211 Cellulitis of face: Secondary | ICD-10-CM | POA: Diagnosis not present

## 2021-09-11 ENCOUNTER — Ambulatory Visit (INDEPENDENT_AMBULATORY_CARE_PROVIDER_SITE_OTHER): Payer: Medicare Other | Admitting: Gastroenterology

## 2021-09-18 DIAGNOSIS — I10 Essential (primary) hypertension: Secondary | ICD-10-CM | POA: Diagnosis not present

## 2021-09-18 DIAGNOSIS — E119 Type 2 diabetes mellitus without complications: Secondary | ICD-10-CM | POA: Diagnosis not present

## 2021-09-18 DIAGNOSIS — E785 Hyperlipidemia, unspecified: Secondary | ICD-10-CM | POA: Diagnosis not present

## 2021-09-20 DIAGNOSIS — N3941 Urge incontinence: Secondary | ICD-10-CM | POA: Diagnosis not present

## 2021-09-20 DIAGNOSIS — N401 Enlarged prostate with lower urinary tract symptoms: Secondary | ICD-10-CM | POA: Diagnosis not present

## 2021-09-20 DIAGNOSIS — R972 Elevated prostate specific antigen [PSA]: Secondary | ICD-10-CM | POA: Diagnosis not present

## 2021-09-20 DIAGNOSIS — N200 Calculus of kidney: Secondary | ICD-10-CM | POA: Diagnosis not present

## 2021-10-18 DIAGNOSIS — I10 Essential (primary) hypertension: Secondary | ICD-10-CM | POA: Diagnosis not present

## 2021-10-18 DIAGNOSIS — E119 Type 2 diabetes mellitus without complications: Secondary | ICD-10-CM | POA: Diagnosis not present

## 2021-10-18 DIAGNOSIS — E785 Hyperlipidemia, unspecified: Secondary | ICD-10-CM | POA: Diagnosis not present

## 2021-10-24 DIAGNOSIS — L304 Erythema intertrigo: Secondary | ICD-10-CM | POA: Diagnosis not present

## 2021-10-24 DIAGNOSIS — B372 Candidiasis of skin and nail: Secondary | ICD-10-CM | POA: Diagnosis not present

## 2021-11-14 DIAGNOSIS — L308 Other specified dermatitis: Secondary | ICD-10-CM | POA: Diagnosis not present

## 2021-11-17 DIAGNOSIS — E785 Hyperlipidemia, unspecified: Secondary | ICD-10-CM | POA: Diagnosis not present

## 2021-11-17 DIAGNOSIS — I10 Essential (primary) hypertension: Secondary | ICD-10-CM | POA: Diagnosis not present

## 2021-11-17 DIAGNOSIS — E119 Type 2 diabetes mellitus without complications: Secondary | ICD-10-CM | POA: Diagnosis not present

## 2021-12-06 DIAGNOSIS — G720 Drug-induced myopathy: Secondary | ICD-10-CM | POA: Diagnosis not present

## 2021-12-06 DIAGNOSIS — E1122 Type 2 diabetes mellitus with diabetic chronic kidney disease: Secondary | ICD-10-CM | POA: Diagnosis not present

## 2021-12-06 DIAGNOSIS — R7309 Other abnormal glucose: Secondary | ICD-10-CM | POA: Diagnosis not present

## 2021-12-17 DIAGNOSIS — E785 Hyperlipidemia, unspecified: Secondary | ICD-10-CM | POA: Diagnosis not present

## 2021-12-17 DIAGNOSIS — I10 Essential (primary) hypertension: Secondary | ICD-10-CM | POA: Diagnosis not present

## 2021-12-17 DIAGNOSIS — E119 Type 2 diabetes mellitus without complications: Secondary | ICD-10-CM | POA: Diagnosis not present

## 2022-01-04 ENCOUNTER — Encounter (INDEPENDENT_AMBULATORY_CARE_PROVIDER_SITE_OTHER): Payer: Self-pay | Admitting: Gastroenterology

## 2022-01-04 ENCOUNTER — Ambulatory Visit (INDEPENDENT_AMBULATORY_CARE_PROVIDER_SITE_OTHER): Payer: Medicare Other | Admitting: Gastroenterology

## 2022-01-04 ENCOUNTER — Other Ambulatory Visit: Payer: Self-pay

## 2022-01-04 VITALS — BP 151/89 | HR 84 | Temp 98.6°F | Ht 74.0 in | Wt 276.5 lb

## 2022-01-04 DIAGNOSIS — Z800 Family history of malignant neoplasm of digestive organs: Secondary | ICD-10-CM

## 2022-01-04 DIAGNOSIS — K2100 Gastro-esophageal reflux disease with esophagitis, without bleeding: Secondary | ICD-10-CM

## 2022-01-04 MED ORDER — OMEPRAZOLE 20 MG PO CPDR: 20.0000 mg | DELAYED_RELEASE_CAPSULE | Freq: Every day | ORAL | 3 refills | Status: DC

## 2022-01-04 NOTE — Patient Instructions (Addendum)
Schedule colonoscopy In May 2023 Decrease omeprazole 20 mg qday Please call us back if you present recurrent episodes of trouble swallowing or heartburn Continue with weight loss

## 2022-01-04 NOTE — Progress Notes (Signed)
Micheal [name], M.D. Gastroenterology & Hepatology [facility] [address] [place], [place] [address]  Primary Care Physician: Micheal Noble, MD [address] [place] [address]  I will communicate my assessment and recommendations to the referring MD via EMR.  Problems: Grade B esophagitis GERD  History of Present Illness: Micheal [name]. is a 71 y.o. male with past medical history of diabetes, hyperlipidemia, hypertension, OSA and GERD, who presents for follow-up of dysphagia due to esophagitis.  The patient was last seen on 08/29/2020. At that time, the patient was continued on omeprazole 40 mg once a day.  Patient denies having any complaints such as dysphagia, heartburn or odynophagia. Has been taking omeprazole 40 mg qday. The patient denies having any nausea, vomiting, fever, chills, hematochezia, melena, hematemesis, abdominal distention, abdominal pain, diarrhea, jaundice, pruritus. Has tried losing some weight,  has lost close to 30 lb.  Last EGD: 06/28/2020.  EGD showed presence of grade B esophagitis.  Biopsies from the mid and distal esophagus were taken which were positive for changes consistent with reflux but negative for eosinophils.  There was also a scalloped mucosa in his duodenum but this was negative for celiac disease.  Last Colonoscopy: 03/20/2017  - Diverticulosis in the sigmoid colon. - The examination was otherwise normal. - Anal papilla(e) were hypertrophied. - No specimens collected.  Recommended repeat in 5 years.  Past Medical History: Past Medical History:  Diagnosis Date   Diabetes mellitus    NIDDM   History of kidney stones    Hyperlipidemia    Kidney stones    Seasonal allergies    Sleep apnea sleep study 12/08/2007   no CPAP use; uses a mouth guard at night   Umbilical hernia     Past Surgical History: Past Surgical History:  Procedure Laterality Date   BIOPSY  06/28/2020    Procedure: BIOPSY;  Surgeon: Harvel Quale, MD;  [facility];  Service: Gastroenterology;;   COLONOSCOPY  10/28/2006; 10/23/2011   COLONOSCOPY  10/23/2011   SLF:  1). Internal hemorroids, small  2). POLYPOID LESION (AC) . next TCS 10/2016   COLONOSCOPY N/A 03/20/2017   Procedure: COLONOSCOPY;  Surgeon: Rogene Houston, MD;  [facility];  Service: Endoscopy;  Laterality: N/A;  1030-rescheduled 03/20/17 per Lelon Frohlich - pt to arrive at 10:00   [place]  04/12/2011   right retrograde; basketing of stone   ESOPHAGOGASTRODUODENOSCOPY N/A 10/12/2014   RMR:  abnormal esophagus as described above query EOE status post biopsy; food impaction passes spontaneously with intubation of the esophagus c/w EOE, probable noncritical stricture at GEJ with possible ring component. food bolus had passed.    ESOPHAGOGASTRODUODENOSCOPY N/A 11/16/2014   Procedure: ESOPHAGOGASTRODUODENOSCOPY (EGD);  Surgeon: Daneil Dolin, MD;  [facility];  Service: Endoscopy;  Laterality: N/A;  0830   ESOPHAGOGASTRODUODENOSCOPY (EGD) WITH PROPOFOL N/A 06/28/2020   Procedure: ESOPHAGOGASTRODUODENOSCOPY (EGD) WITH PROPOFOL;  Surgeon: Harvel Quale, MD;  [facility];  Service: Gastroenterology;  Laterality: N/A;  [place] N/A 11/16/2014   Procedure: Venia Minks DILATION;  Surgeon: Daneil Dolin, MD;  [facility];  Service: Endoscopy;  Laterality: N/A;   SAVORY DILATION N/A 11/16/2014   Procedure: SAVORY DILATION;  Surgeon: Daneil Dolin, MD;  [facility];  Service: Endoscopy;  Laterality: N/A;   UMBILICAL HERNIA REPAIR  10/26/2011  Procedure: HERNIA REPAIR UMBILICAL ADULT;  Surgeon: Rolm Bookbinder, MD;  Location: [place];  Service: General;  Laterality: N/A;  umbilical hernia repair with mesh    Family History: Family History  Problem Relation Age of Onset   Cancer  Mother        colon, age 77 deceased   Stroke Father    Diabetes Brother     Social History: Social History   Tobacco Use  Smoking Status Never  Smokeless Tobacco Never   Social History   Substance and Sexual Activity  Alcohol Use Yes   Alcohol/week: 1.0 - 3.0 standard drink   Types: 1 - 3 Cans of beer per week   Comment: occasionally   Social History   Substance and Sexual Activity  Drug Use No    Allergies: No Known Allergies  Medications: Current Outpatient Medications  Medication Sig Dispense Refill   aspirin EC 81 MG tablet Take 81 mg by mouth daily.      empagliflozin (JARDIANCE) 10 MG TABS tablet Take by mouth.     ezetimibe (ZETIA) 10 MG tablet Take 10 mg by mouth daily.     glimepiride (AMARYL) 2 MG tablet Take 2 mg by mouth daily with breakfast.      LANTUS SOLOSTAR 100 UNIT/ML Solostar Pen SMARTSIG:10 Unit(s) SUB-Q Every Morning     magnesium oxide (MAG-OX) 400 MG tablet Take 400 mg by mouth daily.     metFORMIN (GLUCOPHAGE-XR) 500 MG 24 hr tablet Take 2,000 mg by mouth daily with breakfast.      MYRBETRIQ 50 MG TB24 tablet Take 50 mg by mouth daily.     omeprazole (PRILOSEC) 40 MG capsule Take 1 capsule (40 mg total) by mouth daily. 90 capsule 4   oxybutynin (DITROPAN-XL) 10 MG 24 hr tablet Take 10 mg by mouth daily.     pioglitazone (ACTOS) 15 MG tablet Take 15 mg by mouth daily.     VICTOZA 18 MG/3ML SOPN Inject 1.8 mg into the skin daily.     No current facility-administered medications for this visit.    Review of Systems: GENERAL: negative for malaise, night sweats HEENT: No changes in hearing or vision, no nose bleeds or other nasal problems. NECK: Negative for lumps, goiter, pain and significant neck swelling RESPIRATORY: Negative for cough, wheezing CARDIOVASCULAR: Negative for chest pain, leg swelling, palpitations, orthopnea GI: SEE HPI MUSCULOSKELETAL: Negative for joint pain or swelling, back pain, and muscle pain. SKIN: Negative for  lesions, rash PSYCH: Negative for sleep disturbance, mood disorder and recent psychosocial stressors. HEMATOLOGY Negative for prolonged bleeding, bruising easily, and swollen nodes. ENDOCRINE: Negative for cold or heat intolerance, polyuria, polydipsia and goiter. NEURO: negative for tremor, gait imbalance, syncope and seizures. The remainder of the review of systems is noncontributory.   Physical Exam: BP (!) 151/89 (BP Location: Left Arm, Patient Position: Sitting, Cuff Size: Large)    Pulse 84    Temp 98.6 F (37 C) (Oral)    Ht 6\' 2"  (1.88 m)    Wt 276 lb 8 oz (125.4 kg)    BMI 35.50 kg/m  GENERAL: The patient is AO x3, in no acute distress. Obese. HEENT: Head is normocephalic and atraumatic. EOMI are intact. Mouth is well hydrated and without lesions. NECK: Supple. No masses LUNGS: Clear to auscultation. No presence of rhonchi/wheezing/rales. Adequate chest expansion HEART: RRR, normal s1 and s2. ABDOMEN: Soft, nontender, no guarding, no peritoneal signs, and nondistended. BS +. No masses. EXTREMITIES: Without any cyanosis,  clubbing, rash, lesions or edema. NEUROLOGIC: AOx3, no focal motor deficit. SKIN: no jaundice, no rashes  Imaging/Labs: as above  I personally reviewed and interpreted the available labs, imaging and endoscopic files.  Impression and Plan: Navi Erber. is a 71 y.o. male with past medical history of diabetes, hyperlipidemia, hypertension, OSA and GERD, who presents for follow-up of dysphagia due to esophagitis.  The patient presented improvement of his symptoms while taking omeprazole 40 mg every day and has not presented any complaints so far.  We will attempt to decrease his omeprazole to 20 mg every day and continue this indefinitely.  Notably, he has lost weight which will help decrease the likelihood of more reflux episodes.  I encouraged him to keep working on the weight loss.  Given family history of colorectal cancer, we will repeat a colonoscopy in  May 2023.  - Schedule colonoscopy In May 2023 - Decrease omeprazole 20 mg qday -Patient to call us back if you present recurrent episodes of trouble swallowing or heartburn - Continue with weight loss  All questions were answered.      Harvel Quale, MD Gastroenterology and Hepatology [facility]

## 2022-01-16 DIAGNOSIS — I10 Essential (primary) hypertension: Secondary | ICD-10-CM | POA: Diagnosis not present

## 2022-01-16 DIAGNOSIS — E785 Hyperlipidemia, unspecified: Secondary | ICD-10-CM | POA: Diagnosis not present

## 2022-01-16 DIAGNOSIS — E119 Type 2 diabetes mellitus without complications: Secondary | ICD-10-CM | POA: Diagnosis not present

## 2022-02-01 DIAGNOSIS — E1129 Type 2 diabetes mellitus with other diabetic kidney complication: Secondary | ICD-10-CM | POA: Diagnosis not present

## 2022-02-01 DIAGNOSIS — E785 Hyperlipidemia, unspecified: Secondary | ICD-10-CM | POA: Diagnosis not present

## 2022-02-01 DIAGNOSIS — N200 Calculus of kidney: Secondary | ICD-10-CM | POA: Diagnosis not present

## 2022-02-01 DIAGNOSIS — I10 Essential (primary) hypertension: Secondary | ICD-10-CM | POA: Diagnosis not present

## 2022-02-01 DIAGNOSIS — Z125 Encounter for screening for malignant neoplasm of prostate: Secondary | ICD-10-CM | POA: Diagnosis not present

## 2022-02-01 DIAGNOSIS — E669 Obesity, unspecified: Secondary | ICD-10-CM | POA: Diagnosis not present

## 2022-02-01 DIAGNOSIS — Z79899 Other long term (current) drug therapy: Secondary | ICD-10-CM | POA: Diagnosis not present

## 2022-02-07 DIAGNOSIS — Z6835 Body mass index (BMI) 35.0-35.9, adult: Secondary | ICD-10-CM | POA: Diagnosis not present

## 2022-02-07 DIAGNOSIS — T466X1A Poisoning by antihyperlipidemic and antiarteriosclerotic drugs, accidental (unintentional), initial encounter: Secondary | ICD-10-CM | POA: Diagnosis not present

## 2022-02-07 DIAGNOSIS — E119 Type 2 diabetes mellitus without complications: Secondary | ICD-10-CM | POA: Diagnosis not present

## 2022-02-07 DIAGNOSIS — R7309 Other abnormal glucose: Secondary | ICD-10-CM | POA: Diagnosis not present

## 2022-02-07 DIAGNOSIS — I4510 Unspecified right bundle-branch block: Secondary | ICD-10-CM | POA: Diagnosis not present

## 2022-02-07 DIAGNOSIS — E785 Hyperlipidemia, unspecified: Secondary | ICD-10-CM | POA: Diagnosis not present

## 2022-02-07 DIAGNOSIS — Z0001 Encounter for general adult medical examination with abnormal findings: Secondary | ICD-10-CM | POA: Diagnosis not present

## 2022-02-07 DIAGNOSIS — I10 Essential (primary) hypertension: Secondary | ICD-10-CM | POA: Diagnosis not present

## 2022-02-13 ENCOUNTER — Encounter (INDEPENDENT_AMBULATORY_CARE_PROVIDER_SITE_OTHER): Payer: Self-pay | Admitting: *Deleted

## 2022-02-16 DIAGNOSIS — E785 Hyperlipidemia, unspecified: Secondary | ICD-10-CM | POA: Diagnosis not present

## 2022-02-16 DIAGNOSIS — I10 Essential (primary) hypertension: Secondary | ICD-10-CM | POA: Diagnosis not present

## 2022-02-16 DIAGNOSIS — E119 Type 2 diabetes mellitus without complications: Secondary | ICD-10-CM | POA: Diagnosis not present

## 2022-02-21 ENCOUNTER — Encounter (INDEPENDENT_AMBULATORY_CARE_PROVIDER_SITE_OTHER): Payer: Self-pay

## 2022-02-21 ENCOUNTER — Telehealth (INDEPENDENT_AMBULATORY_CARE_PROVIDER_SITE_OTHER): Payer: Self-pay

## 2022-02-21 ENCOUNTER — Other Ambulatory Visit (INDEPENDENT_AMBULATORY_CARE_PROVIDER_SITE_OTHER): Payer: Self-pay

## 2022-02-21 MED ORDER — NA SULFATE-K SULFATE-MG SULF 17.5-3.13-1.6 GM/177ML PO SOLN: 1.0000 | ORAL | 0 refills | Status: DC

## 2022-02-21 NOTE — Telephone Encounter (Signed)
Dameer Speiser Ann Devonna Oboyle, CMA  ?

## 2022-03-18 DIAGNOSIS — I10 Essential (primary) hypertension: Secondary | ICD-10-CM | POA: Diagnosis not present

## 2022-03-18 DIAGNOSIS — E785 Hyperlipidemia, unspecified: Secondary | ICD-10-CM | POA: Diagnosis not present

## 2022-03-18 DIAGNOSIS — E119 Type 2 diabetes mellitus without complications: Secondary | ICD-10-CM | POA: Diagnosis not present

## 2022-03-21 DIAGNOSIS — R972 Elevated prostate specific antigen [PSA]: Secondary | ICD-10-CM | POA: Diagnosis not present

## 2022-03-21 DIAGNOSIS — N401 Enlarged prostate with lower urinary tract symptoms: Secondary | ICD-10-CM | POA: Diagnosis not present

## 2022-03-21 DIAGNOSIS — N3941 Urge incontinence: Secondary | ICD-10-CM | POA: Diagnosis not present

## 2022-03-21 DIAGNOSIS — R3915 Urgency of urination: Secondary | ICD-10-CM | POA: Diagnosis not present

## 2022-03-21 DIAGNOSIS — N200 Calculus of kidney: Secondary | ICD-10-CM | POA: Diagnosis not present

## 2022-03-21 DIAGNOSIS — N281 Cyst of kidney, acquired: Secondary | ICD-10-CM | POA: Diagnosis not present

## 2022-04-02 DIAGNOSIS — E119 Type 2 diabetes mellitus without complications: Secondary | ICD-10-CM | POA: Diagnosis not present

## 2022-04-11 ENCOUNTER — Ambulatory Visit (HOSPITAL_COMMUNITY): Payer: PPO | Admitting: Anesthesiology

## 2022-04-11 ENCOUNTER — Encounter (HOSPITAL_COMMUNITY)
Admission: RE | Disposition: A | Discharge status: Home / Self Care | Payer: Self-pay | Source: Home / Self Care | Attending: Gastroenterology

## 2022-04-11 ENCOUNTER — Other Ambulatory Visit: Payer: Self-pay

## 2022-04-11 ENCOUNTER — Ambulatory Visit (HOSPITAL_BASED_OUTPATIENT_CLINIC_OR_DEPARTMENT_OTHER): Payer: PPO | Admitting: Anesthesiology

## 2022-04-11 ENCOUNTER — Ambulatory Visit (HOSPITAL_COMMUNITY)
Admission: RE | Admit: 2022-04-11 | Discharge: 2022-04-11 | Disposition: A | Discharge status: Home / Self Care | Payer: PPO | Attending: Gastroenterology | Admitting: Gastroenterology

## 2022-04-11 DIAGNOSIS — E119 Type 2 diabetes mellitus without complications: Secondary | ICD-10-CM | POA: Insufficient documentation

## 2022-04-11 DIAGNOSIS — D126 Benign neoplasm of colon, unspecified: Secondary | ICD-10-CM

## 2022-04-11 DIAGNOSIS — G4733 Obstructive sleep apnea (adult) (pediatric): Secondary | ICD-10-CM

## 2022-04-11 DIAGNOSIS — K635 Polyp of colon: Secondary | ICD-10-CM | POA: Diagnosis not present

## 2022-04-11 DIAGNOSIS — Z1211 Encounter for screening for malignant neoplasm of colon: Secondary | ICD-10-CM | POA: Insufficient documentation

## 2022-04-11 DIAGNOSIS — G4730 Sleep apnea, unspecified: Secondary | ICD-10-CM | POA: Insufficient documentation

## 2022-04-11 DIAGNOSIS — D128 Benign neoplasm of rectum: Secondary | ICD-10-CM | POA: Diagnosis not present

## 2022-04-11 DIAGNOSIS — D123 Benign neoplasm of transverse colon: Secondary | ICD-10-CM | POA: Diagnosis not present

## 2022-04-11 DIAGNOSIS — K5730 Diverticulosis of large intestine without perforation or abscess without bleeding: Secondary | ICD-10-CM | POA: Insufficient documentation

## 2022-04-11 DIAGNOSIS — K219 Gastro-esophageal reflux disease without esophagitis: Secondary | ICD-10-CM | POA: Insufficient documentation

## 2022-04-11 DIAGNOSIS — K621 Rectal polyp: Secondary | ICD-10-CM | POA: Diagnosis not present

## 2022-04-11 DIAGNOSIS — Z794 Long term (current) use of insulin: Secondary | ICD-10-CM | POA: Insufficient documentation

## 2022-04-11 DIAGNOSIS — Z7984 Long term (current) use of oral hypoglycemic drugs: Secondary | ICD-10-CM | POA: Insufficient documentation

## 2022-04-11 DIAGNOSIS — E669 Obesity, unspecified: Secondary | ICD-10-CM

## 2022-04-11 DIAGNOSIS — K648 Other hemorrhoids: Secondary | ICD-10-CM

## 2022-04-11 DIAGNOSIS — Z800 Family history of malignant neoplasm of digestive organs: Secondary | ICD-10-CM | POA: Insufficient documentation

## 2022-04-11 DIAGNOSIS — K449 Diaphragmatic hernia without obstruction or gangrene: Secondary | ICD-10-CM

## 2022-04-11 DIAGNOSIS — K420 Umbilical hernia with obstruction, without gangrene: Secondary | ICD-10-CM

## 2022-04-11 DIAGNOSIS — D124 Benign neoplasm of descending colon: Secondary | ICD-10-CM | POA: Insufficient documentation

## 2022-04-11 HISTORY — PX: COLONOSCOPY WITH PROPOFOL: SHX5780

## 2022-04-11 HISTORY — PX: POLYPECTOMY: SHX149

## 2022-04-11 LAB — GLUCOSE, CAPILLARY: Glucose-Capillary: 157 mg/dL — ABNORMAL HIGH (ref 70–99)

## 2022-04-11 LAB — HM COLONOSCOPY

## 2022-04-11 SURGERY — COLONOSCOPY WITH PROPOFOL
Anesthesia: General

## 2022-04-11 MED ORDER — LIDOCAINE HCL (CARDIAC) PF 100 MG/5ML IV SOSY
PREFILLED_SYRINGE | INTRAVENOUS | Status: DC | PRN
  Administered 2022-04-11: 50 mg via INTRAVENOUS

## 2022-04-11 MED ORDER — PROPOFOL 500 MG/50ML IV EMUL
INTRAVENOUS | Status: DC | PRN
  Administered 2022-04-11: 125 ug/kg/min via INTRAVENOUS

## 2022-04-11 MED ORDER — LACTATED RINGERS IV SOLN: INTRAVENOUS | Status: DC

## 2022-04-11 NOTE — Op Note (Signed)
[facility] Patient Name: Micheal [name] Procedure Date: 04/11/2022 10:05 AM MRN: [id number] Date of Birth: 1951-08-25 Attending MD: [name] ,  CSN: [id number] Age: 71 Admit Type: Outpatient Procedure:                Colonoscopy Indications:              Screening in patient at increased risk: Family                            history of 1st-degree relative with colorectal                            cancer before age 11 years Providers:                [name], Colfax Page, Bostic Risa Grill, Technician Referring MD:              Medicines:                Monitored Anesthesia Care Complications:            No immediate complications. Estimated Blood Loss:     Estimated blood loss: none. Procedure:                Pre-Anesthesia Assessment:                           - Prior to the procedure, a History and Physical                            was performed, and patient medications, allergies                            and sensitivities were reviewed. The patient's                            tolerance of previous anesthesia was reviewed.                           - The risks and benefits of the procedure and the                            sedation options and risks were discussed with the                            patient. All questions were answered and informed                            consent was obtained.                           - ASA Grade Assessment: III - A patient with severe                            systemic disease.  After obtaining informed consent, the colonoscope                            was passed under direct vision. Throughout the                            procedure, the patient's blood pressure, pulse, and                            oxygen saturations were monitored continuously. The                            [id number]-HQ190L ([id number]) scope was introduced through                             the anus and advanced to the the cecum, identified                            by appendiceal orifice and ileocecal valve. The                            colonoscopy was performed without difficulty. The                            patient tolerated the procedure well. The quality                            of the bowel preparation was excellent. Scope In: 10:17:26 AM Scope Out: 10:42:25 AM Scope Withdrawal Time: 0 hours 18 minutes 57 seconds  Total Procedure Duration: 0 hours 24 minutes 59 seconds  Findings:      The perianal and digital rectal examinations were normal.      Five sessile polyps were found in the rectum, descending colon and       transverse colon. The polyps were 3 to 6 mm in size. These polyps were       removed with a cold snare. Resection and retrieval were complete.      A few small and large-mouthed diverticula were found in the sigmoid       colon and descending colon.      Internal hemorrhoids were found during retroflexion. The hemorrhoids       were small. Impression:               - Five 3 to 6 mm polyps in the rectum, in the                            descending colon and in the transverse colon,                            removed with a cold snare. Resected and retrieved.                           - Diverticulosis in the sigmoid colon and in the  descending colon.                           - Internal hemorrhoids. Moderate Sedation:      Per Anesthesia Care Recommendation:           - Discharge patient to home (ambulatory).                           - Resume previous diet.                           - Await pathology results.                           - Repeat colonoscopy in 3 years for surveillance. Procedure Code(s):        --- Professional ---                           [phone], Colonoscopy, flexible; with removal of                            tumor(s), polyp(s), or other lesion(s) by snare                             technique Diagnosis Code(s):        --- Professional ---                           Z80.0, Family history of malignant neoplasm of                            digestive organs                           K62.1, Rectal polyp                           K63.5, Polyp of colon                           K64.8, Other hemorrhoids                           K57.30, Diverticulosis of large intestine without                            perforation or abscess without bleeding CPT copyright 2019 American Medical Association. All rights reserved. The codes documented in this report are preliminary and upon coder review may  be revised to meet current compliance requirements. [name], MD [name],  04/11/2022 10:46:38 AM This report has been signed electronically. Number of Addenda: 0

## 2022-04-11 NOTE — Anesthesia Preprocedure Evaluation (Addendum)
Anesthesia Evaluation  Patient identified by MRN, date of birth, ID band Patient awake    Reviewed: Allergy & Precautions, NPO status , Patient's Chart, lab work & pertinent test results  Airway Mallampati: III  TM Distance: >3 FB Neck ROM: Full  Mouth opening: Limited Mouth Opening  Dental  (+) Dental Advisory Given, Missing, Implants   Pulmonary sleep apnea and Continuous Positive Airway Pressure Ventilation ,    Pulmonary exam normal breath sounds clear to auscultation       Cardiovascular negative cardio ROS Normal cardiovascular exam Rhythm:Regular Rate:Normal     Neuro/Psych  Neuromuscular disease negative psych ROS   GI/Hepatic Neg liver ROS, hiatal hernia, GERD  Medicated and Controlled,  Endo/Other  diabetes, Well Controlled, Type 2, Oral Hypoglycemic Agents, Insulin Dependent  Renal/GU Renal disease  negative genitourinary   Musculoskeletal negative musculoskeletal ROS (+)   Abdominal   Peds negative pediatric ROS (+)  Hematology negative hematology ROS (+)   Anesthesia Other Findings   Reproductive/Obstetrics negative OB ROS                            Anesthesia Physical Anesthesia Plan  ASA: 3  Anesthesia Plan: General   Post-op Pain Management: Minimal or no pain anticipated   Induction: Intravenous  PONV Risk Score and Plan: Propofol infusion  Airway Management Planned: Nasal Cannula and Natural Airway  Additional Equipment:   Intra-op Plan:   Post-operative Plan:   Informed Consent: I have reviewed the patients History and Physical, chart, labs and discussed the procedure including the risks, benefits and alternatives for the proposed anesthesia with the patient or authorized representative who has indicated his/her understanding and acceptance.     Dental advisory given  Plan Discussed with: CRNA and Surgeon  Anesthesia Plan Comments:         Anesthesia Quick Evaluation

## 2022-04-11 NOTE — Discharge Instructions (Signed)
You are being discharged to home.  Resume your previous diet.  We are waiting for your pathology results.  Your physician has recommended a repeat colonoscopy in three years for surveillance.  

## 2022-04-11 NOTE — H&P (Signed)
Micheal Arlotta. is an 71 y.o. male.   Chief Complaint: family history colon cancer HPI: Micheal [name]. is a 71 y.o. male with past medical history of diabetes, hyperlipidemia, hypertension, OSA and GERD, coming to the hospital for Family history of colon cancer.  The patient denies having any nausea, vomiting, fever, chills, hematochezia, melena, hematemesis, abdominal distention, abdominal pain, diarrhea, jaundice, pruritus or weight loss.  Past Medical History:  Diagnosis Date   Diabetes mellitus    NIDDM   History of kidney stones    Hyperlipidemia    Kidney stones    Seasonal allergies    Sleep apnea sleep study 12/08/2007   no CPAP use; uses a mouth guard at night   Umbilical hernia     Past Surgical History:  Procedure Laterality Date   BIOPSY  06/28/2020   Procedure: BIOPSY;  Surgeon: Harvel Quale, MD;  [facility];  Service: Gastroenterology;;   COLONOSCOPY  10/28/2006; 10/23/2011   COLONOSCOPY  10/23/2011   SLF:  1). Internal hemorroids, small  2). POLYPOID LESION (AC) . next TCS 10/2016   COLONOSCOPY N/A 03/20/2017   Procedure: COLONOSCOPY;  Surgeon: Rogene Houston, MD;  [facility];  Service: Endoscopy;  Laterality: N/A;  1030-rescheduled 03/20/17 per Lelon Frohlich - pt to arrive at 10:00   [place]  04/12/2011   right retrograde; basketing of stone   ESOPHAGOGASTRODUODENOSCOPY N/A 10/12/2014   RMR:  abnormal esophagus as described above query EOE status post biopsy; food impaction passes spontaneously with intubation of the esophagus c/w EOE, probable noncritical stricture at GEJ with possible ring component. food bolus had passed.    ESOPHAGOGASTRODUODENOSCOPY N/A 11/16/2014   Procedure: ESOPHAGOGASTRODUODENOSCOPY (EGD);  Surgeon: Daneil Dolin, MD;  [facility];  Service: Endoscopy;  Laterality: N/A;  0830   ESOPHAGOGASTRODUODENOSCOPY (EGD) WITH PROPOFOL N/A 06/28/2020   Procedure:  ESOPHAGOGASTRODUODENOSCOPY (EGD) WITH PROPOFOL;  Surgeon: Harvel Quale, MD;  [facility];  Service: Gastroenterology;  Laterality: N/A;  [place] N/A 11/16/2014   Procedure: Venia Minks DILATION;  Surgeon: Daneil Dolin, MD;  [facility];  Service: Endoscopy;  Laterality: N/A;   SAVORY DILATION N/A 11/16/2014   Procedure: SAVORY DILATION;  Surgeon: Daneil Dolin, MD;  [facility];  Service: Endoscopy;  Laterality: N/A;   UMBILICAL HERNIA REPAIR  10/26/2011   Procedure: HERNIA REPAIR UMBILICAL ADULT;  Surgeon: Rolm Bookbinder, MD;  Location: [place];  Service: General;  Laterality: N/A;  umbilical hernia repair with mesh    Family History  Problem Relation Age of Onset   Cancer Mother        colon, age 17 deceased   Stroke Father    Diabetes Brother    Social History:  reports that he has never smoked. He has never used smokeless tobacco. He reports current alcohol use of about 1.0 - 3.0 standard drink per week. He reports that he does not use drugs.  Allergies: No Known Allergies  Medications Prior to Admission  Medication Sig Dispense Refill   aspirin EC 81 MG tablet Take 81 mg by mouth daily.      empagliflozin (JARDIANCE) 25 MG TABS tablet Take 25 mg by mouth daily.     ezetimibe (ZETIA) 10 MG tablet Take 10 mg by mouth daily.     glimepiride (AMARYL) 2 MG tablet Take 2 mg by mouth  daily with breakfast.      LANTUS SOLOSTAR 100 UNIT/ML Solostar Pen 15 Units daily.     magnesium oxide (MAG-OX) 400 MG tablet Take 400 mg by mouth daily.     metFORMIN (GLUCOPHAGE-XR) 500 MG 24 hr tablet Take 2,000 mg by mouth daily with breakfast.      MYRBETRIQ 50 MG TB24 tablet Take 50 mg by mouth daily.     Na Sulfate-K Sulfate-Mg Sulf (SUPREP BOWEL PREP KIT) 17.5-3.13-1.6 GM/177ML SOLN Take 1 kit by mouth as directed. 354 mL 0   omeprazole (PRILOSEC) 20 MG capsule Take 1 capsule (20 mg total) by mouth  daily. 90 capsule 3   oxybutynin (DITROPAN-XL) 10 MG 24 hr tablet Take 10 mg by mouth daily.     pioglitazone (ACTOS) 15 MG tablet Take 15 mg by mouth daily.     VICTOZA 18 MG/3ML SOPN Inject 1.8 mg into the skin daily.      Results for orders placed or performed during the hospital encounter of 04/11/22 (from the past 48 hour(s))  Glucose, capillary     Status: Abnormal   Collection Time: 04/11/22  8:57 AM  Result Value Ref Range   Glucose-Capillary 157 (H) 70 - 99 mg/dL    Comment: Glucose reference range applies only to samples taken after fasting for at least 8 hours.   No results found.  Review of Systems  Constitutional: Negative.   HENT: Negative.    Eyes: Negative.   Respiratory: Negative.    Cardiovascular: Negative.   Gastrointestinal: Negative.   Endocrine: Negative.   Genitourinary: Negative.   Musculoskeletal: Negative.   Skin: Negative.   Allergic/Immunologic: Negative.   Neurological: Negative.   Hematological: Negative.   Psychiatric/Behavioral: Negative.     Blood pressure 118/80, pulse 86, temperature 98.3 F (36.8 C), resp. rate 20, height 6' 2"  (1.88 m), weight 114.8 kg, SpO2 96 %. Physical Exam  GENERAL: The patient is AO x3, in no acute distress. HEENT: Head is normocephalic and atraumatic. EOMI are intact. Mouth is well hydrated and without lesions. NECK: Supple. No masses LUNGS: Clear to auscultation. No presence of rhonchi/wheezing/rales. Adequate chest expansion HEART: RRR, normal s1 and s2. ABDOMEN: Soft, nontender, no guarding, no peritoneal signs, and nondistended. BS +. No masses. EXTREMITIES: Without any cyanosis, clubbing, rash, lesions or edema. NEUROLOGIC: AOx3, no focal motor deficit. SKIN: no jaundice, no rashes  Assessment/Plan Micheal [name]. is a 71 y.o. male with past medical history of diabetes, hyperlipidemia, hypertension, OSA and GERD, coming to the hospital for Family history of colon cancer.  We will proceed with  colonoscopy.  Harvel Quale, MD 04/11/2022, 10:05 AM

## 2022-04-11 NOTE — Transfer of Care (Signed)
Immediate Anesthesia Transfer of Care Note  Patient: Micheal [name].  Procedure(s) Performed: COLONOSCOPY WITH PROPOFOL POLYPECTOMY INTESTINAL  Patient Location: PACU and Endoscopy Unit  Anesthesia Type:General  Level of Consciousness: awake  Airway & Oxygen Therapy: Patient Spontanous Breathing  Post-op Assessment: Report given to RN  Post vital signs: Reviewed and stable  Last Vitals:  Vitals Value Taken Time  BP    Temp    Pulse    Resp    SpO2      Last Pain:  Vitals:   04/11/22 1013  TempSrc:   PainSc: 0-No pain      Patients Stated Pain Goal: 7 (69/24/93 2419)  Complications: No notable events documented.

## 2022-04-11 NOTE — Anesthesia Postprocedure Evaluation (Signed)
Anesthesia Post Note  Patient: Micheal [name].  Procedure(s) Performed: COLONOSCOPY WITH PROPOFOL POLYPECTOMY INTESTINAL  Patient location during evaluation: Phase II Anesthesia Type: General Level of consciousness: awake and alert and oriented Pain management: pain level controlled Vital Signs Assessment: post-procedure vital signs reviewed and stable Respiratory status: spontaneous breathing, nonlabored ventilation and respiratory function stable Cardiovascular status: blood pressure returned to baseline and stable Postop Assessment: no apparent nausea or vomiting Anesthetic complications: no   No notable events documented.   Last Vitals:  Vitals:   04/11/22 0853 04/11/22 1046  BP: 118/80 105/66  Pulse: 86 82  Resp: 20 14  Temp:  36.8 C  SpO2: 96% 96%    Last Pain:  Vitals:   04/11/22 1046  TempSrc: Oral  PainSc: 0-No pain                 Brownie Nehme C Normagene Harvie

## 2022-04-12 ENCOUNTER — Encounter (INDEPENDENT_AMBULATORY_CARE_PROVIDER_SITE_OTHER): Payer: Self-pay | Admitting: *Deleted

## 2022-04-12 LAB — SURGICAL PATHOLOGY

## 2022-04-18 ENCOUNTER — Encounter (HOSPITAL_COMMUNITY): Payer: Self-pay | Admitting: Gastroenterology

## 2022-04-18 DIAGNOSIS — E785 Hyperlipidemia, unspecified: Secondary | ICD-10-CM | POA: Diagnosis not present

## 2022-04-18 DIAGNOSIS — I10 Essential (primary) hypertension: Secondary | ICD-10-CM | POA: Diagnosis not present

## 2022-04-18 DIAGNOSIS — E119 Type 2 diabetes mellitus without complications: Secondary | ICD-10-CM | POA: Diagnosis not present

## 2022-06-06 DIAGNOSIS — E1129 Type 2 diabetes mellitus with other diabetic kidney complication: Secondary | ICD-10-CM | POA: Diagnosis not present

## 2022-06-12 DIAGNOSIS — R7309 Other abnormal glucose: Secondary | ICD-10-CM | POA: Diagnosis not present

## 2022-06-12 DIAGNOSIS — I10 Essential (primary) hypertension: Secondary | ICD-10-CM | POA: Diagnosis not present

## 2022-06-12 DIAGNOSIS — E1122 Type 2 diabetes mellitus with diabetic chronic kidney disease: Secondary | ICD-10-CM | POA: Diagnosis not present

## 2022-07-04 ENCOUNTER — Other Ambulatory Visit: Payer: Self-pay | Admitting: *Deleted

## 2022-07-04 NOTE — Patient Outreach (Signed)
  Care Coordination   07/04/2022 Name: Micheal [name]. MRN: [id number] DOB: [birth year]   Care Coordination Outreach Attempts:  Contact was made with the patient today to offer care coordination services as a benefit of their health plan. The patient requested a return call on a later date.   Follow Up Plan:  Additional outreach attempts will be made to offer the patient care coordination information and services.   Encounter Outcome:  Pt. Request to Call Back  Care Coordination Interventions Activated:  No   Care Coordination Interventions:  No, not indicated    Valente David, RN, MSN, [facility] Care Coordinator [phone]

## 2022-07-09 ENCOUNTER — Other Ambulatory Visit: Payer: Self-pay | Admitting: *Deleted

## 2022-07-09 NOTE — Patient Outreach (Signed)
  Care Coordination   07/09/2022 Name: Micheal [name]. MRN: [id number] DOB: 1951-05-09   Care Coordination Outreach Attempts:  Contact was made with the patient today to offer care coordination services as a benefit of their health plan. The patient requested a return call on a later date.   Follow Up Plan:  Additional outreach attempts will be made to offer the patient care coordination information and services.   Encounter Outcome:  Pt. Request to Call Back  Care Coordination Interventions Activated:  No   Care Coordination Interventions:  No, not indicated    Valente David, RN, MSN, [facility] Care Coordinator [phone]

## 2022-07-12 ENCOUNTER — Other Ambulatory Visit: Payer: Self-pay | Admitting: *Deleted

## 2022-07-12 NOTE — Patient Outreach (Signed)
  Care Coordination   07/12/2022 Name: Micheal [name]. MRN: [id number] DOB: 06-14-1951   Care Coordination Outreach Attempts:  A third unsuccessful outreach was attempted today to offer the patient with information about available care coordination services as a benefit of their health plan.   Follow Up Plan:  No further outreach attempts will be made at this time. We have been unable to contact the patient to offer or enroll patient in care coordination services  Encounter Outcome:  No Answer  Care Coordination Interventions Activated:  No   Care Coordination Interventions:  No, not indicated    Valente David, RN, MSN, [facility] Care Management Care Management Coordinator [phone]

## 2022-07-21 ENCOUNTER — Emergency Department (HOSPITAL_COMMUNITY)
Admission: EM | Admit: 2022-07-21 | Discharge: 2022-07-21 | Disposition: A | Discharge status: Home / Self Care | Payer: PPO | Attending: Emergency Medicine | Admitting: Emergency Medicine

## 2022-07-21 ENCOUNTER — Encounter (HOSPITAL_COMMUNITY): Payer: Self-pay

## 2022-07-21 ENCOUNTER — Other Ambulatory Visit: Payer: Self-pay

## 2022-07-21 DIAGNOSIS — E119 Type 2 diabetes mellitus without complications: Secondary | ICD-10-CM | POA: Diagnosis not present

## 2022-07-21 DIAGNOSIS — Z7982 Long term (current) use of aspirin: Secondary | ICD-10-CM | POA: Diagnosis not present

## 2022-07-21 DIAGNOSIS — Z7984 Long term (current) use of oral hypoglycemic drugs: Secondary | ICD-10-CM | POA: Diagnosis not present

## 2022-07-21 DIAGNOSIS — L02211 Cutaneous abscess of abdominal wall: Secondary | ICD-10-CM | POA: Diagnosis not present

## 2022-07-21 MED ORDER — SULFAMETHOXAZOLE-TRIMETHOPRIM 800-160 MG PO TABS
1.0000 | ORAL_TABLET | Freq: Once | ORAL | Status: AC
  Administered 2022-07-21: 1 via ORAL
  Filled 2022-07-21: qty 1

## 2022-07-21 MED ORDER — SULFAMETHOXAZOLE-TRIMETHOPRIM 800-160 MG PO TABS: 1.0000 | ORAL_TABLET | Freq: Two times a day (BID) | ORAL | 0 refills | Status: AC

## 2022-07-21 MED ORDER — LIDOCAINE-EPINEPHRINE (PF) 2 %-1:200000 IJ SOLN
10.0000 mL | Freq: Once | INTRAMUSCULAR | Status: AC
  Administered 2022-07-21: 10 mL
  Filled 2022-07-21: qty 20

## 2022-07-21 NOTE — Discharge Instructions (Addendum)
You were seen in the emergency department for an abscess.  We have drained the area and cleaned it. I would like you to have the wound checked in 2-3 days. This can be done by any doctor's office, urgent care, or emergency department. This is to make sure the area hasn't closed too soon. Try to keep the area as clean and dry as possible. It is okay to let warm soapy water run over the area, but do NOT scrub the area.   I am placing you on a course of antibiotics. It is important you finish the entire course! You can take ibuprofen or tylenol as needed for pain.   Continue to monitor how you're doing and return to the ER for new or worsening symptoms.  

## 2022-07-21 NOTE — ED Provider Notes (Signed)
[facility] EMERGENCY DEPARTMENT Provider Note   CSN: [id number] Arrival date & time: 07/21/22  1927     History  Chief Complaint  Patient presents with   Abscess    Micheal [name]. is a 71 y.o. male with history of hyperlipidemia, diabetes, CPAP who presents the emergency department complaining of an abscess on his abdomen.  Patient initially felt he had a ingrown hair that began swelling and draining.  He went to urgent care, they recommended that he go to the ER to be evaluated by a surgeon.  He denies any fevers or chills.  No history of prior abscesses.   Abscess Associated symptoms: no fever        Home Medications Prior to Admission medications   Medication Sig Start Date End Date Taking? Authorizing Provider  sulfamethoxazole-trimethoprim (BACTRIM DS) 800-160 MG tablet Take 1 tablet by mouth 2 (two) times daily for 7 days. 07/21/22 07/28/22 Yes Jenniffer Vessels T, PA-C  aspirin EC 81 MG tablet Take 81 mg by mouth daily.     [provider]  empagliflozin (JARDIANCE) 25 MG TABS tablet Take 25 mg by mouth daily.    [provider]  ezetimibe (ZETIA) 10 MG tablet Take 10 mg by mouth daily. 04/29/20   [provider]  glimepiride (AMARYL) 2 MG tablet Take 2 mg by mouth daily with breakfast.     [provider]  LANTUS SOLOSTAR 100 UNIT/ML Solostar Pen 15 Units daily. 12/07/21   [provider]  magnesium oxide (MAG-OX) 400 MG tablet Take 400 mg by mouth daily.    [provider]  metFORMIN (GLUCOPHAGE-XR) 500 MG 24 hr tablet Take 2,000 mg by mouth daily with breakfast.  09/23/11   [provider]  MYRBETRIQ 50 MG TB24 tablet Take 50 mg by mouth daily. 12/07/21   [provider]  omeprazole (PRILOSEC) 20 MG capsule Take 1 capsule (20 mg total) by mouth daily. 01/04/22   Harvel Quale, MD  oxybutynin (DITROPAN-XL) 10 MG 24 hr tablet Take 10 mg by mouth daily. 12/07/21   [provider]   pioglitazone (ACTOS) 15 MG tablet Take 15 mg by mouth daily. 05/10/20   [provider]  VICTOZA 18 MG/3ML SOPN Inject 1.8 mg into the skin daily. 10/09/16   [provider]      Allergies    Patient has no known allergies.    Review of Systems   Review of Systems  Constitutional:  Negative for chills and fever.  Skin:        Abscess on abdomen  All other systems reviewed and are negative.   Physical Exam Updated Vital Signs BP 114/72 (BP Location: Left Arm)   Pulse 78   Temp 98.6 F (37 C) (Oral)   Resp 16   Ht '6\' 3"'$  (1.905 m)   Wt 115.2 kg   SpO2 100%   BMI 31.75 kg/m  Physical Exam Vitals and nursing note reviewed.  Constitutional:      Appearance: Normal appearance.  HENT:     Head: Normocephalic and atraumatic.  Eyes:     Conjunctiva/sclera: Conjunctivae normal.  Pulmonary:     Effort: Pulmonary effort is normal. No respiratory distress.  Abdominal:       Comments: Approximately 3 cm area of erythema, 6 cm area of induration. Small amount of purulent drainage  Skin:    General: Skin is warm and dry.  Neurological:     Mental Status: He is alert.  Psychiatric:        Mood and Affect: Mood normal.        Behavior: Behavior normal.     ED Results / Procedures / Treatments   Labs (all labs ordered are listed, but only abnormal results are displayed) Labs Reviewed - No data to display  EKG None  Radiology No results found.  Procedures .Marland KitchenIncision and Drainage  Date/Time: 07/21/2022 9:15 PM  Performed by: Kateri Plummer, PA-C Authorized by: Kateri Plummer, PA-C   Consent:    Consent obtained:  Verbal   Consent given by:  Patient   Risks, benefits, and alternatives were discussed: yes     Risks discussed:  Bleeding, incomplete drainage and pain Universal protocol:    Procedure explained and questions answered to patient or proxy's satisfaction: yes     Patient identity confirmed:  Verbally with patient Location:     Type:  Abscess   Size:  3 cm   Location:  Trunk   Trunk location:  Abdomen Pre-procedure details:    Skin preparation:  Povidone-iodine Sedation:    Sedation type:  None Anesthesia:    Anesthesia method:  Local infiltration   Local anesthetic:  Lidocaine 2% WITH epi Procedure type:    Complexity:  Simple Procedure details:    Needle aspiration: yes     Needle size:  25 G   Incision types:  Single straight   Wound management:  Probed and deloculated and irrigated with saline   Drainage:  Bloody and purulent   Drainage amount:  Scant   Wound treatment:  Wound left open   Packing materials:  None Post-procedure details:    Procedure completion:  Tolerated well, no immediate complications     Medications Ordered in ED Medications  lidocaine-EPINEPHrine (XYLOCAINE W/EPI) 2 %-1:200000 (PF) injection 10 mL (10 mLs Infiltration Given 07/21/22 2024)  sulfamethoxazole-trimethoprim (BACTRIM DS) 800-160 MG per tablet 1 tablet (1 tablet Oral Given 07/21/22 2105)    ED Course/ Medical Decision Making/ A&P                           Medical Decision Making Risk Prescription drug management.   Patient is a 71 year old male who presents to the emergency department for an abscess.  Physical exam: Abscess with approximately 3 cm area of erythema and 6 cm area of induration with small amount of purulent drainage.   Procedure: Abscess was incised and drained. Area was anesthestized with lidocaine + epinephrine. Patient tolerated procedure well with no immediate complications. Abscess not large enough to warrant packing.   Disposition: Patient is not requiring admission or inpatient treatment further symptoms.  Patient was placed on course of antibiotics and instructed to have a wound check in 3 days.  We discussed reasons to return to the emergency department, and patient is agreeable to the plan.  Final Clinical Impression(s) / ED Diagnoses Final diagnoses:  Abscess of skin of abdomen     Rx / DC Orders ED Discharge Orders          Ordered    sulfamethoxazole-trimethoprim (BACTRIM DS) 800-160 MG tablet  2 times daily        07/21/22 2059           Portions of this report may have been transcribed using voice recognition software. Every effort was made to ensure accuracy; however, inadvertent computerized transcription errors may be present.    Kateri Plummer, Hershal Coria 07/21/22 2116  Noemi Chapel, MD 07/22/22 [phone]

## 2022-07-21 NOTE — ED Notes (Signed)
ED Provider at bedside. 

## 2022-07-21 NOTE — ED Triage Notes (Signed)
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

## 2022-07-26 ENCOUNTER — Encounter (INDEPENDENT_AMBULATORY_CARE_PROVIDER_SITE_OTHER): Payer: Self-pay | Admitting: *Deleted

## 2022-08-29 ENCOUNTER — Encounter (HOSPITAL_COMMUNITY): Payer: Self-pay | Admitting: Emergency Medicine

## 2022-08-29 ENCOUNTER — Other Ambulatory Visit: Payer: Self-pay

## 2022-08-29 ENCOUNTER — Emergency Department (HOSPITAL_COMMUNITY)
Admission: EM | Admit: 2022-08-29 | Discharge: 2022-08-29 | Disposition: A | Discharge status: Home / Self Care | Payer: PPO | Attending: Emergency Medicine | Admitting: Emergency Medicine

## 2022-08-29 DIAGNOSIS — L0291 Cutaneous abscess, unspecified: Secondary | ICD-10-CM

## 2022-08-29 DIAGNOSIS — L02811 Cutaneous abscess of head [any part, except face]: Secondary | ICD-10-CM | POA: Diagnosis not present

## 2022-08-29 DIAGNOSIS — Z7982 Long term (current) use of aspirin: Secondary | ICD-10-CM | POA: Insufficient documentation

## 2022-08-29 DIAGNOSIS — Z79899 Other long term (current) drug therapy: Secondary | ICD-10-CM | POA: Diagnosis not present

## 2022-08-29 MED ORDER — SULFAMETHOXAZOLE-TRIMETHOPRIM 800-160 MG PO TABS: 1.0000 | ORAL_TABLET | Freq: Two times a day (BID) | ORAL | 0 refills | Status: AC

## 2022-08-29 MED ORDER — SULFAMETHOXAZOLE-TRIMETHOPRIM 800-160 MG PO TABS
1.0000 | ORAL_TABLET | Freq: Once | ORAL | Status: AC
  Administered 2022-08-29: 1 via ORAL
  Filled 2022-08-29: qty 1

## 2022-08-29 MED ORDER — LIDOCAINE-EPINEPHRINE-TETRACAINE (LET) TOPICAL GEL
3.0000 mL | Freq: Once | TOPICAL | Status: AC
  Administered 2022-08-29: 3 mL via TOPICAL
  Filled 2022-08-29: qty 3

## 2022-08-29 NOTE — ED Provider Notes (Signed)
[facility] EMERGENCY DEPARTMENT Provider Note   CSN: [id number] Arrival date & time: 08/29/22  1724     History  Chief Complaint  Patient presents with   Abscess    Micheal Kue. is a 71 y.o. male here for evaluation of abscess to posterior head.  Began approximate 1 week ago.  States Micheal [name] has been "squeezing stuff out" of the spot.  Micheal [name] is also been using warm water on at home.  Micheal [name] states symptoms have not improved however not worsened.  Micheal [name] has no pain to his neck.  No headache.  No numbness or weakness.  No fever.  States Micheal [name] has had multiple recent "boils" that Micheal [name] has developed over the last year or so.  Prior to that had not had any significant similar events.  States typically Micheal [name] starts antibiotics and resolve on their own.  Denies any known history of MRSA.  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  HPI     Home Medications Prior to Admission medications   Medication Sig Start Date End Date Taking? Authorizing Provider  aspirin EC 81 MG tablet Take 81 mg by mouth daily.     [provider]  empagliflozin (JARDIANCE) 25 MG TABS tablet Take 25 mg by mouth daily.    [provider]  ezetimibe (ZETIA) 10 MG tablet Take 10 mg by mouth daily. 04/29/20   [provider]  glimepiride (AMARYL) 2 MG tablet Take 2 mg by mouth daily with breakfast.     [provider]  LANTUS SOLOSTAR 100 UNIT/ML Solostar Pen 15 Units daily. 12/07/21   [provider]  magnesium oxide (MAG-OX) 400 MG tablet Take 400 mg by mouth daily.    [provider]  metFORMIN (GLUCOPHAGE-XR) 500 MG 24 hr tablet Take 2,000 mg by mouth daily with breakfast.  09/23/11   [provider]  MYRBETRIQ 50 MG TB24 tablet Take 50 mg by mouth daily. 12/07/21   [provider]  omeprazole (PRILOSEC) 20 MG capsule Take 1 capsule (20 mg total) by mouth daily. 01/04/22   Harvel Quale, MD  oxybutynin (DITROPAN-XL) 10 MG  24 hr tablet Take 10 mg by mouth daily. 12/07/21   [provider]  pioglitazone (ACTOS) 15 MG tablet Take 15 mg by mouth daily. 05/10/20   [provider]  VICTOZA 18 MG/3ML SOPN Inject 1.8 mg into the skin daily. 10/09/16   [provider]      Allergies    Patient has no known allergies.    Review of Systems   Review of Systems  Constitutional: Negative.   HENT: Negative.    Respiratory: Negative.    Cardiovascular: Negative.   Gastrointestinal: Negative.   Genitourinary: Negative.   Musculoskeletal: Negative.   Skin:  Positive for wound.  Neurological: Negative.   All other systems reviewed and are negative.   Physical Exam Updated Vital Signs BP (!) 124/95 (BP Location: Right Arm)   Pulse 100   Temp 99.4 F (37.4 C) (Oral)   Resp 17   Ht '6\' 2"'$  (1.88 m)   Wt 115.2 kg   SpO2 97%   BMI 32.61 kg/m  Physical Exam Vitals and nursing note reviewed.  Constitutional:      General: Micheal [name] is not in acute distress.    Appearance: Micheal [name] is well-developed. Micheal [name] is not ill-appearing, toxic-appearing or diaphoretic.  HENT:     Head: Normocephalic and atraumatic.  Jaw: There is normal jaw occlusion.      Comments: 3cm area of induration to posterior occiput of head. Punctuate central area of yellow thick drainage. Does not extend into posterior neck.    Nose: Nose normal.     Mouth/Throat:     Mouth: Mucous membranes are moist.  Eyes:     Pupils: Pupils are equal, round, and reactive to light.  Neck:     Trachea: Trachea and phonation normal.  Cardiovascular:     Rate and Rhythm: Normal rate and regular rhythm.     Pulses: Normal pulses.     Heart sounds: Normal heart sounds.  Pulmonary:     Effort: Pulmonary effort is normal. No respiratory distress.     Breath sounds: Normal breath sounds and air entry.  Abdominal:     General: There is no distension.     Palpations: Abdomen is soft.  Musculoskeletal:        General: Normal range of motion.      Cervical back: Full passive range of motion without pain, normal range of motion and neck supple.  Lymphadenopathy:     Cervical: No cervical adenopathy.  Skin:    General: Skin is warm and dry.     Capillary Refill: Capillary refill takes less than 2 seconds.     Comments: 3 cm area of induration posterior aspect head surrounding erythema.  No warmth.  Punctate area of thick, yellow drainage.  Neurological:     General: No focal deficit present.     Mental Status: Micheal [name] is alert and oriented to person, place, and time.     ED Results / Procedures / Treatments   Labs (all labs ordered are listed, but only abnormal results are displayed) Labs Reviewed - No data to display  EKG None  Radiology No results found.  Procedures .Marland KitchenIncision and Drainage  Date/Time: 08/29/2022 9:32 PM  Performed by: Nettie Elm, PA-C Authorized by: Nettie Elm, PA-C   Consent:    Consent obtained:  Verbal   Consent given by:  Patient   Risks discussed:  Bleeding, incomplete drainage, pain and damage to other organs   Alternatives discussed:  No treatment Universal protocol:    Procedure explained and questions answered to patient or proxy's satisfaction: yes     Relevant documents present and verified: yes     Test results available : yes     Imaging studies available: yes     Required blood products, implants, devices, and special equipment available: yes     Site/side marked: yes     Immediately prior to procedure, a time out was called: yes     Patient identity confirmed:  Verbally with patient Location:    Type:  Abscess   Location:  Head   Head location:  Scalp Pre-procedure details:    Skin preparation:  Betadine Anesthesia:    Anesthesia method:  Topical application   Topical anesthetic:  LET Procedure type:    Complexity:  Complex Procedure details:    Incision types:  Single straight   Incision depth:  Subcutaneous   Wound management:  Probed and deloculated,  irrigated with saline and extensive cleaning   Drainage:  Purulent   Drainage amount:  Moderate   Wound treatment:  Wound left open   Packing materials:  None Post-procedure details:    Procedure completion:  Tolerated well, no immediate complications     Medications Ordered in ED Medications  lidocaine-EPINEPHrine-tetracaine (LET) topical gel (3 mLs  Topical Given 08/29/22 2055)  sulfamethoxazole-trimethoprim (BACTRIM DS) 800-160 MG per tablet 1 tablet (1 tablet Oral Given 08/29/22 2129)    ED Course/ Medical Decision Making/ A&P    71 year old here for evaluation of possible abscess to posterior scalp present x1 week.  Micheal [name] is afebrile, nonseptic, not ill-appearing.  Micheal [name] has no systemic symptoms.  On exam Micheal [name] has 3 cm area of induration posterior aspect head with some surrounding erythema, punctate area of thick yellow drainage.  Does not extend into posterior neck.  Micheal [name] has a nonfocal neuro exam.  I low suspicion for deep space abscess.  I did perform bedside ultrasound which showed superficial area amenable to I&D.  Some surrounding cobblestoning consistent with cellulitis.  Do not feel Micheal [name] needs CT imaging or labs at this time.  I&D performed.  Thick, yellow, white drainage, suspect this could be possible infected cyst vs abscess.   Patient started antibiotics, first dose here in ED.  Discussed warm compress.  We will not large enough for packing.  Micheal [name] will need to follow-up outpatient for definitive management if this is a cyst.  Return in 3 days for wound check or sooner for worsening symptoms  The patient has been appropriately medically screened and/or stabilized in the ED. I have low suspicion for any other emergent medical condition which would require further screening, evaluation or treatment in the ED or require inpatient management.  Patient is hemodynamically stable and in no acute distress.  Patient able to ambulate in department prior to ED.  Evaluation does not show acute  pathology that would require ongoing or additional emergent interventions while in the emergency department or further inpatient treatment.  I have discussed the diagnosis with the patient and answered all questions.  Pain is been managed while in the emergency department and patient has no further complaints prior to discharge.  Patient is comfortable with plan discussed in room and is stable for discharge at this time.  I have discussed strict return precautions for returning to the emergency department.  Patient was encouraged to follow-up with PCP/specialist refer to at discharge.                            Medical Decision Making Amount and/or Complexity of Data Reviewed External Data Reviewed: notes.  Risk OTC drugs. Prescription drug management. Decision regarding hospitalization. Diagnosis or treatment significantly limited by social determinants of health.          Final Clinical Impression(s) / ED Diagnoses Final diagnoses:  Abscess    Rx / DC Orders ED Discharge Orders     None         Sonnie Bias A, PA-C 08/29/22 2133    Godfrey Pick, MD 08/30/22 [phone]

## 2022-08-29 NOTE — Discharge Instructions (Signed)
Take antibiotics as prescribed.  We want this area to continue draining.  Warm compress to area.  If symptoms do not improve over the next 3 days follow-up with general surgery  Return for new or worsening symptoms

## 2022-08-29 NOTE — ED Triage Notes (Signed)
Abscess on back of head x 1 wk, pt states attempts to drain it himself but unsuccessful. States warm showers help.

## 2022-09-10 DIAGNOSIS — E1129 Type 2 diabetes mellitus with other diabetic kidney complication: Secondary | ICD-10-CM | POA: Diagnosis not present

## 2022-09-20 DIAGNOSIS — I10 Essential (primary) hypertension: Secondary | ICD-10-CM | POA: Diagnosis not present

## 2022-09-20 DIAGNOSIS — E1122 Type 2 diabetes mellitus with diabetic chronic kidney disease: Secondary | ICD-10-CM | POA: Diagnosis not present

## 2022-09-20 DIAGNOSIS — Z23 Encounter for immunization: Secondary | ICD-10-CM | POA: Diagnosis not present

## 2022-09-21 DIAGNOSIS — R972 Elevated prostate specific antigen [PSA]: Secondary | ICD-10-CM | POA: Diagnosis not present

## 2022-09-21 DIAGNOSIS — N401 Enlarged prostate with lower urinary tract symptoms: Secondary | ICD-10-CM | POA: Diagnosis not present

## 2022-09-21 DIAGNOSIS — N281 Cyst of kidney, acquired: Secondary | ICD-10-CM | POA: Diagnosis not present

## 2022-09-21 DIAGNOSIS — N3941 Urge incontinence: Secondary | ICD-10-CM | POA: Diagnosis not present

## 2022-09-21 DIAGNOSIS — R81 Glycosuria: Secondary | ICD-10-CM | POA: Diagnosis not present

## 2022-09-21 DIAGNOSIS — R350 Frequency of micturition: Secondary | ICD-10-CM | POA: Diagnosis not present

## 2022-09-21 DIAGNOSIS — N200 Calculus of kidney: Secondary | ICD-10-CM | POA: Diagnosis not present

## 2022-09-29 ENCOUNTER — Encounter (INDEPENDENT_AMBULATORY_CARE_PROVIDER_SITE_OTHER): Payer: Self-pay | Admitting: Gastroenterology

## 2022-12-11 ENCOUNTER — Encounter (INDEPENDENT_AMBULATORY_CARE_PROVIDER_SITE_OTHER): Payer: Self-pay | Admitting: Gastroenterology

## 2022-12-14 DIAGNOSIS — E1129 Type 2 diabetes mellitus with other diabetic kidney complication: Secondary | ICD-10-CM | POA: Diagnosis not present

## 2022-12-21 DIAGNOSIS — B354 Tinea corporis: Secondary | ICD-10-CM | POA: Diagnosis not present

## 2022-12-21 DIAGNOSIS — E1122 Type 2 diabetes mellitus with diabetic chronic kidney disease: Secondary | ICD-10-CM | POA: Diagnosis not present

## 2022-12-21 DIAGNOSIS — R7309 Other abnormal glucose: Secondary | ICD-10-CM | POA: Diagnosis not present

## 2022-12-27 ENCOUNTER — Ambulatory Visit (INDEPENDENT_AMBULATORY_CARE_PROVIDER_SITE_OTHER): Payer: PPO | Admitting: Gastroenterology

## 2022-12-27 ENCOUNTER — Encounter (INDEPENDENT_AMBULATORY_CARE_PROVIDER_SITE_OTHER): Payer: Self-pay | Admitting: Gastroenterology

## 2022-12-27 VITALS — BP 137/77 | HR 96 | Temp 98.4°F | Ht 74.0 in | Wt 281.8 lb

## 2022-12-27 DIAGNOSIS — K2100 Gastro-esophageal reflux disease with esophagitis, without bleeding: Secondary | ICD-10-CM | POA: Diagnosis not present

## 2022-12-27 DIAGNOSIS — R7989 Other specified abnormal findings of blood chemistry: Secondary | ICD-10-CM | POA: Insufficient documentation

## 2022-12-27 MED ORDER — OMEPRAZOLE 20 MG PO CPDR: 20.0000 mg | DELAYED_RELEASE_CAPSULE | Freq: Every day | ORAL | 3 refills | Status: DC

## 2022-12-27 NOTE — Progress Notes (Signed)
Micheal [name], M.D. Gastroenterology & Hepatology [facility] [address] [place], [place] [address]  Primary Care Physician: Asencion Noble, MD [address] [place] [address]  I will communicate my assessment and recommendations to the referring MD via EMR.  Problems: Grade B esophagitis GERD  History of Present Illness: Micheal Polak. is a 72 y.o. male with past medical history of diabetes, hyperlipidemia, hypertension, OSA and GERD, who presents for follow-up of GERD.   The patient was last seen on 01/04/2022. At that time, the patient had his omeprazole decreased to 20 mg every day.  Patient denies having any heartburn, dysphagia or odynophagia.  The patient has been taking omeprazole 20 mg daily compliantly.  The patient denies having any nausea, vomiting, fever, chills, hematochezia, melena, hematemesis, abdominal distention, abdominal pain, diarrhea, jaundice, pruritus.  He has been 2 kg since the last time seen in clinic.  Most recent labs from 01/20/2018 showed mildly elevated AST of 40, ALT of 45, total bilirubin 0.4 and alkaline phosphatase 78.  Last EGD: 06/28/2020.  EGD showed presence of grade B esophagitis.  Biopsies from the mid and distal esophagus were taken which were positive for changes consistent with reflux but negative for eosinophils.  There was also a scalloped mucosa in his duodenum but this was negative for celiac disease.    Last Colonoscopy: 04/11/2022 5 polyps between 3 to 6 mm were removed from the rectum, ascending colon and transverse colon, there was presence of diverticulosis in the sigmoid and descending colon, internal hemorrhoids. All polyps were tubular adenomas. Recommended to have a repeat colonoscopy in 3 years  Past Medical History: Past Medical History:  Diagnosis Date   Diabetes mellitus    NIDDM   History of kidney stones    Hyperlipidemia    Kidney stones    Seasonal  allergies    Sleep apnea sleep study 12/08/2007   no CPAP use; uses a mouth guard at night   Umbilical hernia     Past Surgical History: Past Surgical History:  Procedure Laterality Date   BIOPSY  06/28/2020   Procedure: BIOPSY;  Surgeon: Harvel Quale, MD;  [facility];  Service: Gastroenterology;;   COLONOSCOPY  10/28/2006; 10/23/2011   COLONOSCOPY  10/23/2011   SLF:  1). Internal hemorroids, small  2). POLYPOID LESION (AC) . next TCS 10/2016   COLONOSCOPY N/A 03/20/2017   Procedure: COLONOSCOPY;  Surgeon: Rogene Houston, MD;  [facility];  Service: Endoscopy;  Laterality: N/A;  1030-rescheduled 03/20/17 per Lelon Frohlich - pt to arrive at 10:00   COLONOSCOPY WITH PROPOFOL N/A 04/11/2022   Procedure: COLONOSCOPY WITH PROPOFOL;  Surgeon: Harvel Quale, MD;  [facility];  Service: Gastroenterology;  Laterality: N/A;  10:00   CYSTOSCOPY W/ URETERAL STENT PLACEMENT  04/12/2011   right retrograde; basketing of stone   ESOPHAGOGASTRODUODENOSCOPY N/A 10/12/2014   RMR:  abnormal esophagus as described above query EOE status post biopsy; food impaction passes spontaneously with intubation of the esophagus c/w EOE, probable noncritical stricture at GEJ with possible ring component. food bolus had passed.    ESOPHAGOGASTRODUODENOSCOPY N/A 11/16/2014   Procedure: ESOPHAGOGASTRODUODENOSCOPY (EGD);  Surgeon: Daneil Dolin, MD;  [facility];  Service: Endoscopy;  Laterality: N/A;  0830   ESOPHAGOGASTRODUODENOSCOPY (EGD) WITH PROPOFOL N/A 06/28/2020   Procedure: ESOPHAGOGASTRODUODENOSCOPY (EGD) WITH PROPOFOL;  Surgeon: Harvel Quale, MD;  [facility];  Service: Gastroenterology;  Laterality: N/A;  [place] N/A 11/16/2014   Procedure: Venia Minks DILATION;  Surgeon: Daneil Dolin, MD;  [facility];  Service: Endoscopy;  Laterality: N/A;   POLYPECTOMY  04/11/2022   Procedure:  POLYPECTOMY INTESTINAL;  Surgeon: Harvel Quale, MD;  [facility];  Service: Gastroenterology;;   Azzie Almas DILATION N/A 11/16/2014   Procedure: Azzie Almas DILATION;  Surgeon: Daneil Dolin, MD;  [facility];  Service: Endoscopy;  Laterality: N/A;   UMBILICAL HERNIA REPAIR  10/26/2011   Procedure: HERNIA REPAIR UMBILICAL ADULT;  Surgeon: Rolm Bookbinder, MD;  Location: [place];  Service: General;  Laterality: N/A;  umbilical hernia repair with mesh    Family History: Family History  Problem Relation Age of Onset   Cancer Mother        colon, age 3 deceased   Stroke Father    Diabetes Brother     Social History: Social History   Tobacco Use  Smoking Status Never  Smokeless Tobacco Never   Social History   Substance and Sexual Activity  Alcohol Use Yes   Alcohol/week: 1.0 - 3.0 standard drink of alcohol   Types: 1 - 3 Cans of beer per week   Comment: occasionally   Social History   Substance and Sexual Activity  Drug Use No    Allergies: No Known Allergies  Medications: Current Outpatient Medications  Medication Sig Dispense Refill   aspirin EC 81 MG tablet Take 81 mg by mouth daily.      empagliflozin (JARDIANCE) 25 MG TABS tablet Take 25 mg by mouth daily.     ezetimibe (ZETIA) 10 MG tablet Take 10 mg by mouth daily.     glimepiride (AMARYL) 2 MG tablet Take 2 mg by mouth daily with breakfast.      LANTUS SOLOSTAR 100 UNIT/ML Solostar Pen 15 Units daily.     magnesium oxide (MAG-OX) 400 MG tablet Take 400 mg by mouth daily.     metFORMIN (GLUCOPHAGE-XR) 500 MG 24 hr tablet Take 2,000 mg by mouth daily with breakfast.      MYRBETRIQ 50 MG TB24 tablet Take 50 mg by mouth daily.     omeprazole (PRILOSEC) 20 MG capsule Take 1 capsule (20 mg total) by mouth daily. 90 capsule 3   oxybutynin (DITROPAN-XL) 10 MG 24 hr tablet Take 10 mg by mouth daily.     pioglitazone (ACTOS) 15 MG tablet Take 15 mg by mouth daily.      VICTOZA 18 MG/3ML SOPN Inject 1.8 mg into the skin daily.     Zinc 50 MG TABS Take by mouth daily at 6 (six) AM.     No current facility-administered medications for this visit.    Review of Systems: GENERAL: negative for malaise, night sweats HEENT: No changes in hearing or vision, no nose bleeds or other nasal problems. NECK: Negative for lumps, goiter, pain and significant neck swelling RESPIRATORY: Negative for cough, wheezing CARDIOVASCULAR: Negative for chest pain, leg swelling, palpitations, orthopnea GI: SEE HPI MUSCULOSKELETAL: Negative for joint pain or swelling, back pain, and muscle pain. SKIN: Negative for lesions, rash PSYCH: Negative for sleep disturbance, mood disorder and recent psychosocial stressors. HEMATOLOGY Negative for prolonged bleeding, bruising easily, and swollen nodes. ENDOCRINE: Negative for cold or heat intolerance, polyuria, polydipsia and goiter. NEURO: negative for tremor, gait imbalance, syncope and seizures. The remainder of the review of systems is noncontributory.   Physical Exam: BP 137/77 (BP  Location: Left Arm, Patient Position: Sitting, Cuff Size: Large)   Pulse 96   Temp 98.4 F (36.9 C) (Temporal)   Ht '6\' 2"'$  (1.88 m)   Wt 281 lb 12.8 oz (127.8 kg)   BMI 36.18 kg/m  GENERAL: The patient is AO x3, in no acute distress. HEENT: Head is normocephalic and atraumatic. EOMI are intact. Mouth is well hydrated and without lesions. NECK: Supple. No masses LUNGS: Clear to auscultation. No presence of rhonchi/wheezing/rales. Adequate chest expansion HEART: RRR, normal s1 and s2. ABDOMEN: Soft, nontender, no guarding, no peritoneal signs, and nondistended. BS +. No masses. EXTREMITIES: Without any cyanosis, clubbing, rash, lesions or edema. NEUROLOGIC: AOx3, no focal motor deficit. SKIN: no jaundice, no rashes  Imaging/Labs: as above  I personally reviewed and interpreted the available labs, imaging and endoscopic files.  Impression and  Plan: Micheal Shean. is a 72 y.o. male with past medical history of diabetes, hyperlipidemia, hypertension, OSA and GERD, who presents for follow-up of GERD.  patient has presented adequate control of his reflux and he has not presented any more issues with dysphagia.  Has been taking the medication compliantly without any complaints.  Will continue with the same dosage on a daily basis.  He should continue on a PPI indefinitely given his history of dysphagia.  Finally, he was found to have elevated aminotransferases in the past.  I suspect that given his body habitus and comorbidities, this is related to NASH.  Will repeat a CBC and a CMP.  -Continue omeprazole 20 mg qday -Check CBC and CMP  All questions were answered.      Micheal Peppers, MD Gastroenterology and Hepatology [facility]

## 2022-12-27 NOTE — Patient Instructions (Signed)
Continue omeprazole 20 mg qday Perform blood workup

## 2023-01-03 ENCOUNTER — Ambulatory Visit (INDEPENDENT_AMBULATORY_CARE_PROVIDER_SITE_OTHER): Payer: PPO | Admitting: Gastroenterology

## 2023-01-08 ENCOUNTER — Other Ambulatory Visit (INDEPENDENT_AMBULATORY_CARE_PROVIDER_SITE_OTHER): Payer: Self-pay

## 2023-01-08 DIAGNOSIS — R7989 Other specified abnormal findings of blood chemistry: Secondary | ICD-10-CM

## 2023-01-08 DIAGNOSIS — Z130 Encounter for screening for diseases of the blood and blood-forming organs and certain disorders involving the immune mechanism: Secondary | ICD-10-CM

## 2023-01-09 LAB — COMPREHENSIVE METABOLIC PANEL
ALT: 30 IU/L (ref 0–44)
AST: 35 IU/L (ref 0–40)
Albumin/Globulin Ratio: 2 (ref 1.2–2.2)
Albumin: 4.7 g/dL (ref 3.8–4.8)
Alkaline Phosphatase: 139 IU/L — ABNORMAL HIGH (ref 44–121)
BUN/Creatinine Ratio: 13 (ref 10–24)
BUN: 12 mg/dL (ref 8–27)
Bilirubin Total: 0.3 mg/dL (ref 0.0–1.2)
CO2: 24 mmol/L (ref 20–29)
Calcium: 9.3 mg/dL (ref 8.6–10.2)
Chloride: 102 mmol/L (ref 96–106)
Creatinine, Ser: 0.91 mg/dL (ref 0.76–1.27)
Globulin, Total: 2.3 g/dL (ref 1.5–4.5)
Glucose: 101 mg/dL — ABNORMAL HIGH (ref 70–99)
Potassium: 4.5 mmol/L (ref 3.5–5.2)
Sodium: 141 mmol/L (ref 134–144)
Total Protein: 7 g/dL (ref 6.0–8.5)
eGFR: 90 mL/min/{1.73_m2} (ref >59)

## 2023-01-09 LAB — CBC WITH DIFFERENTIAL/PLATELET
Basophils Absolute: 0 10*3/uL (ref 0.0–0.2)
Basos: 1 %
EOS (ABSOLUTE): 0.3 10*3/uL (ref 0.0–0.4)
Eos: 4 %
Hematocrit: 48.1 % (ref 37.5–51.0)
Hemoglobin: 16 g/dL (ref 13.0–17.7)
Immature Grans (Abs): 0 10*3/uL (ref 0.0–0.1)
Immature Granulocytes: 0 %
Lymphocytes Absolute: 1.7 10*3/uL (ref 0.7–3.1)
Lymphs: 29 %
MCH: 29.5 pg (ref 26.6–33.0)
MCHC: 33.3 g/dL (ref 31.5–35.7)
MCV: 89 fL (ref 79–97)
Monocytes Absolute: 0.8 10*3/uL (ref 0.1–0.9)
Monocytes: 14 %
Neutrophils Absolute: 3 10*3/uL (ref 1.4–7.0)
Neutrophils: 52 %
Platelets: 177 10*3/uL (ref 150–450)
RBC: 5.43 x10E6/uL (ref 4.14–5.80)
RDW: 13.2 % (ref 11.6–15.4)
WBC: 5.8 10*3/uL (ref 3.4–10.8)

## 2023-02-14 DIAGNOSIS — K219 Gastro-esophageal reflux disease without esophagitis: Secondary | ICD-10-CM | POA: Diagnosis not present

## 2023-02-14 DIAGNOSIS — R945 Abnormal results of liver function studies: Secondary | ICD-10-CM | POA: Diagnosis not present

## 2023-02-14 DIAGNOSIS — Z79899 Other long term (current) drug therapy: Secondary | ICD-10-CM | POA: Diagnosis not present

## 2023-02-14 DIAGNOSIS — I10 Essential (primary) hypertension: Secondary | ICD-10-CM | POA: Diagnosis not present

## 2023-02-14 DIAGNOSIS — E785 Hyperlipidemia, unspecified: Secondary | ICD-10-CM | POA: Diagnosis not present

## 2023-02-14 DIAGNOSIS — E1129 Type 2 diabetes mellitus with other diabetic kidney complication: Secondary | ICD-10-CM | POA: Diagnosis not present

## 2023-02-14 DIAGNOSIS — G4733 Obstructive sleep apnea (adult) (pediatric): Secondary | ICD-10-CM | POA: Diagnosis not present

## 2023-02-14 DIAGNOSIS — R809 Proteinuria, unspecified: Secondary | ICD-10-CM | POA: Diagnosis not present

## 2023-02-14 DIAGNOSIS — N400 Enlarged prostate without lower urinary tract symptoms: Secondary | ICD-10-CM | POA: Diagnosis not present

## 2023-02-14 DIAGNOSIS — Z125 Encounter for screening for malignant neoplasm of prostate: Secondary | ICD-10-CM | POA: Diagnosis not present

## 2023-04-04 DIAGNOSIS — E119 Type 2 diabetes mellitus without complications: Secondary | ICD-10-CM | POA: Diagnosis not present

## 2023-04-18 DIAGNOSIS — G720 Drug-induced myopathy: Secondary | ICD-10-CM | POA: Diagnosis not present

## 2023-04-18 DIAGNOSIS — E785 Hyperlipidemia, unspecified: Secondary | ICD-10-CM | POA: Diagnosis not present

## 2023-04-18 DIAGNOSIS — K8000 Calculus of gallbladder with acute cholecystitis without obstruction: Secondary | ICD-10-CM | POA: Diagnosis not present

## 2023-04-18 DIAGNOSIS — E1122 Type 2 diabetes mellitus with diabetic chronic kidney disease: Secondary | ICD-10-CM | POA: Diagnosis not present

## 2023-04-23 DIAGNOSIS — N3941 Urge incontinence: Secondary | ICD-10-CM | POA: Diagnosis not present

## 2023-04-23 DIAGNOSIS — N401 Enlarged prostate with lower urinary tract symptoms: Secondary | ICD-10-CM | POA: Diagnosis not present

## 2023-04-23 DIAGNOSIS — N3942 Incontinence without sensory awareness: Secondary | ICD-10-CM | POA: Diagnosis not present

## 2023-04-23 DIAGNOSIS — R350 Frequency of micturition: Secondary | ICD-10-CM | POA: Diagnosis not present

## 2023-04-23 DIAGNOSIS — N200 Calculus of kidney: Secondary | ICD-10-CM | POA: Diagnosis not present

## 2023-04-23 DIAGNOSIS — R972 Elevated prostate specific antigen [PSA]: Secondary | ICD-10-CM | POA: Diagnosis not present

## 2023-04-23 DIAGNOSIS — R351 Nocturia: Secondary | ICD-10-CM | POA: Diagnosis not present

## 2023-04-23 DIAGNOSIS — N281 Cyst of kidney, acquired: Secondary | ICD-10-CM | POA: Diagnosis not present

## 2023-07-12 ENCOUNTER — Emergency Department (HOSPITAL_COMMUNITY)
Admission: EM | Admit: 2023-07-12 | Discharge: 2023-07-12 | Disposition: A | Discharge status: Home / Self Care | Payer: PPO | Attending: Emergency Medicine | Admitting: Emergency Medicine

## 2023-07-12 ENCOUNTER — Other Ambulatory Visit: Payer: Self-pay

## 2023-07-12 ENCOUNTER — Encounter (HOSPITAL_COMMUNITY): Payer: Self-pay | Admitting: *Deleted

## 2023-07-12 DIAGNOSIS — Z7982 Long term (current) use of aspirin: Secondary | ICD-10-CM | POA: Diagnosis not present

## 2023-07-12 DIAGNOSIS — E119 Type 2 diabetes mellitus without complications: Secondary | ICD-10-CM | POA: Diagnosis not present

## 2023-07-12 DIAGNOSIS — L0211 Cutaneous abscess of neck: Secondary | ICD-10-CM | POA: Insufficient documentation

## 2023-07-12 DIAGNOSIS — L0291 Cutaneous abscess, unspecified: Secondary | ICD-10-CM

## 2023-07-12 DIAGNOSIS — Z7984 Long term (current) use of oral hypoglycemic drugs: Secondary | ICD-10-CM | POA: Diagnosis not present

## 2023-07-12 DIAGNOSIS — Z794 Long term (current) use of insulin: Secondary | ICD-10-CM | POA: Insufficient documentation

## 2023-07-12 MED ORDER — LIDOCAINE HCL (PF) 1 % IJ SOLN
30.0000 mL | Freq: Once | INTRAMUSCULAR | Status: AC
  Administered 2023-07-12: 30 mL
  Filled 2023-07-12: qty 30

## 2023-07-12 MED ORDER — DOXYCYCLINE HYCLATE 100 MG PO CAPS: 100.0000 mg | ORAL_CAPSULE | Freq: Two times a day (BID) | ORAL | 0 refills | Status: DC

## 2023-07-12 MED ORDER — IBUPROFEN 400 MG PO TABS
600.0000 mg | ORAL_TABLET | Freq: Once | ORAL | Status: AC
  Administered 2023-07-12: 600 mg via ORAL
  Filled 2023-07-12: qty 2

## 2023-07-12 MED ORDER — DOXYCYCLINE HYCLATE 100 MG PO TABS
100.0000 mg | ORAL_TABLET | Freq: Once | ORAL | Status: AC
  Administered 2023-07-12: 100 mg via ORAL
  Filled 2023-07-12: qty 1

## 2023-07-12 NOTE — Discharge Instructions (Addendum)
Is a pleasure taking care of you today.  You were seen in the ER for an abscess to the left side of your neck.  We drained a small amount of pus out of the abscess.  The area covered with gauze or a bandage.  Make sure you take the full course of antibiotics.  Use warm compresses 2-3 times a day which will help continue to encourage drainage.  Have a wound recheck in 2 to 3 days with primary care or urgent care.  If you develop worsening redness, fever, increased pain or swelling or any other worrisome changes come back to the ER right away.

## 2023-07-12 NOTE — ED Triage Notes (Signed)
Pt with abscess behind left ear for about a week.

## 2023-07-12 NOTE — ED Provider Notes (Signed)
Micheal [name] Provider Note   CSN: [id number] Arrival date & time: 07/12/23  1746     History  Chief Complaint  Patient presents with   Abscess    Micheal [name]. is a 72 y.o. male.  He has past medical history of diabetes, has had abscesses in the past several times.  Complaining of swelling and drainage from area behind the left ear for about a week, he has been squeezing it and getting small amounts of pus out but states it is not getting better so he came into the ER, states he has had to have abscesses drained in the past.  Denies fevers or chills, no nausea or vomiting, no other complaints.   Abscess      Home Medications Prior to Admission medications   Medication Sig Start Date End Date Taking? Authorizing Provider  doxycycline (VIBRAMYCIN) 100 MG capsule Take 1 capsule (100 mg total) by mouth 2 (two) times daily. 07/12/23  Yes Loren Vicens A, PA-C  aspirin EC 81 MG tablet Take 81 mg by mouth daily.     [provider]  empagliflozin (JARDIANCE) 25 MG TABS tablet Take 25 mg by mouth daily.    [provider]  ezetimibe (ZETIA) 10 MG tablet Take 10 mg by mouth daily. 04/29/20   [provider]  glimepiride (AMARYL) 2 MG tablet Take 2 mg by mouth daily with breakfast.     [provider]  LANTUS SOLOSTAR 100 UNIT/ML Solostar Pen 15 Units daily. 12/07/21   [provider]  magnesium oxide (MAG-OX) 400 MG tablet Take 400 mg by mouth daily.    [provider]  metFORMIN (GLUCOPHAGE-XR) 500 MG 24 hr tablet Take 2,000 mg by mouth daily with breakfast.  09/23/11   [provider]  MYRBETRIQ 50 MG TB24 tablet Take 50 mg by mouth daily. 12/07/21   [provider]  omeprazole (PRILOSEC) 20 MG capsule Take 1 capsule (20 mg total) by mouth daily. 12/27/22   [name], MD  oxybutynin (DITROPAN-XL) 10 MG 24 hr tablet Take 10 mg by mouth daily. 12/07/21    [provider]  pioglitazone (ACTOS) 15 MG tablet Take 15 mg by mouth daily. 05/10/20   [provider]  VICTOZA 18 MG/3ML SOPN Inject 1.8 mg into the skin daily. 10/09/16   [provider]  Zinc 50 MG TABS Take by mouth daily at 6 (six) AM.    [provider]      Allergies    Patient has no known allergies.    Review of Systems   Review of Systems  Physical Exam Updated Vital Signs BP 127/82 (BP Location: Right Arm)   Pulse 95   Temp 98.6 F (37 C) (Oral)   Resp 20   Ht 6\' 2"  (1.88 m)   Wt 127.8 kg   SpO2 97%   BMI 36.17 kg/m  Physical Exam Vitals and nursing note reviewed.  Constitutional:      General: He is not in acute distress.    Appearance: He is well-developed.  HENT:     Head: Normocephalic and atraumatic.     Right Ear: Tympanic membrane normal.     Left Ear: Tympanic membrane normal.     Mouth/Throat:     Mouth: Mucous membranes are moist.  Eyes:     Extraocular Movements: Extraocular movements intact.     Conjunctiva/sclera: Conjunctivae normal.     Pupils: Pupils are equal,  round, and reactive to light.  Neck:     Comments: Proximately 4 cm diameter area of induration with overlying cellulitis to the left posterior neck.  No crepitus.  Small amount of thick drainage with small central area of fluctuance. Cardiovascular:     Rate and Rhythm: Normal rate and regular rhythm.     Heart sounds: No murmur heard. Pulmonary:     Effort: Pulmonary effort is normal. No respiratory distress.     Breath sounds: Normal breath sounds.  Abdominal:     Palpations: Abdomen is soft.     Tenderness: There is no abdominal tenderness.  Musculoskeletal:        General: No swelling.     Cervical back: Neck supple.  Skin:    General: Skin is warm and dry.     Capillary Refill: Capillary refill takes less than 2 seconds.  Neurological:     General: No focal deficit present.     Mental Status: He is alert and oriented to person,  place, and time.  Psychiatric:        Mood and Affect: Mood normal.     ED Results / Procedures / Treatments   Labs (all labs ordered are listed, but only abnormal results are displayed) Labs Reviewed - No data to display  EKG None  Radiology No results found.  Procedures .Marland KitchenIncision and Drainage  Date/Time: 07/12/2023 8:11 PM  Performed by: Ma Rings, PA-C Authorized by: Ma Rings, PA-C   Consent:    Consent obtained:  Verbal   Consent given by:  Patient   Risks discussed:  Bleeding, incomplete drainage, damage to other organs and pain Universal protocol:    Procedure explained and questions answered to patient or proxy's satisfaction: yes     Patient identity confirmed:  Verbally with patient Location:    Type:  Abscess   Location:  Neck   Neck location:  L posterior Pre-procedure details:    Skin preparation:  Povidone-iodine Sedation:    Sedation type:  None Anesthesia:    Anesthesia method:  Local infiltration   Local anesthetic:  Lidocaine 1% w/o epi Procedure type:    Complexity:  Simple Procedure details:    Incision types:  Single straight   Incision depth:  Subcutaneous   Drainage:  Purulent and bloody   Drainage amount:  Moderate   Wound treatment:  Wound left open   Packing materials:  None Post-procedure details:    Procedure completion:  Tolerated well, no immediate complications     Medications Ordered in ED Medications  doxycycline (VIBRA-TABS) tablet 100 mg (has no administration in time range)  lidocaine (PF) (XYLOCAINE) 1 % injection 30 mL (30 mLs Infiltration Given 07/12/23 2000)  ibuprofen (ADVIL) tablet 600 mg (600 mg Oral Given 07/12/23 1932)    ED Course/ Medical Decision Making/ A&P                                 Medical Decision Making Ddx: Abscess, lymphadenitis, cellulitis, lipoma, sebaceous cyst, other ED course: Patient with swelling to left posterior neck area for about a week, has been squeezing it and  getting some purulent drainage but minimal, states area keeps closing back up.  There is a indurated swollen area with a small central area of fluctuance.  Ultrasound shows a very small fluid pocket with primarily cobblestoning to the remainder of the inflamed area.  Incision and drainage performed to  open up the area and further drain to provide some source control for infection.  Patient is on doxycycline, he got some relief of his pain after the I&D.  Advised on wound check in 2 to 3 days with PCP or urgent care and strict return precautions.  Do not feel he needs any further imaging or labs at this time, he has no fever or tachycardia, no systemic signs of illness, he is well-appearing, has had this in the past.  There is no indication of a deep space abscess.  Amount and/or Complexity of Data Reviewed External Data Reviewed: notes.  Risk Prescription drug management.           Final Clinical Impression(s) / ED Diagnoses Final diagnoses:  Abscess    Rx / DC Orders ED Discharge Orders          Ordered    doxycycline (VIBRAMYCIN) 100 MG capsule  2 times daily        07/12/23 2007              Josem Kaufmann 07/12/23 2015    Benjiman Core, MD 07/12/23 2325

## 2023-07-30 DIAGNOSIS — R350 Frequency of micturition: Secondary | ICD-10-CM | POA: Diagnosis not present

## 2023-07-30 DIAGNOSIS — Z789 Other specified health status: Secondary | ICD-10-CM | POA: Diagnosis not present

## 2023-07-30 DIAGNOSIS — N401 Enlarged prostate with lower urinary tract symptoms: Secondary | ICD-10-CM | POA: Diagnosis not present

## 2023-07-30 DIAGNOSIS — R3915 Urgency of urination: Secondary | ICD-10-CM | POA: Diagnosis not present

## 2023-07-30 DIAGNOSIS — E119 Type 2 diabetes mellitus without complications: Secondary | ICD-10-CM | POA: Diagnosis not present

## 2023-08-18 ENCOUNTER — Emergency Department (HOSPITAL_COMMUNITY)
Admission: EM | Admit: 2023-08-18 | Discharge: 2023-08-18 | Disposition: A | Discharge status: Home / Self Care | Payer: PPO | Attending: Emergency Medicine | Admitting: Emergency Medicine

## 2023-08-18 ENCOUNTER — Other Ambulatory Visit: Payer: Self-pay

## 2023-08-18 ENCOUNTER — Encounter (HOSPITAL_COMMUNITY): Payer: Self-pay | Admitting: Emergency Medicine

## 2023-08-18 DIAGNOSIS — L0211 Cutaneous abscess of neck: Secondary | ICD-10-CM | POA: Insufficient documentation

## 2023-08-18 DIAGNOSIS — L0291 Cutaneous abscess, unspecified: Secondary | ICD-10-CM

## 2023-08-18 DIAGNOSIS — Z7984 Long term (current) use of oral hypoglycemic drugs: Secondary | ICD-10-CM | POA: Insufficient documentation

## 2023-08-18 DIAGNOSIS — Z794 Long term (current) use of insulin: Secondary | ICD-10-CM | POA: Diagnosis not present

## 2023-08-18 DIAGNOSIS — E119 Type 2 diabetes mellitus without complications: Secondary | ICD-10-CM | POA: Diagnosis not present

## 2023-08-18 MED ORDER — DOXYCYCLINE HYCLATE 100 MG PO CAPS: 100.0000 mg | ORAL_CAPSULE | Freq: Two times a day (BID) | ORAL | 0 refills | Status: DC

## 2023-08-18 MED ORDER — LIDOCAINE-EPINEPHRINE (PF) 2 %-1:200000 IJ SOLN
10.0000 mL | Freq: Once | INTRAMUSCULAR | Status: AC
  Administered 2023-08-18: 10 mL
  Filled 2023-08-18: qty 20

## 2023-08-18 NOTE — ED Provider Notes (Signed)
[facility] AT [facility] Provider Note   CSN: [id number] Arrival date & time: 08/18/23  1004     History  Chief Complaint  Patient presents with   Abscess    Micheal [name]. is a 72 y.o. male.   Abscess   72 year old male presents emergency department with complaints of abscess.  Patient states he has noticed area for the past week or so on the left back of his neck.  States that he has been trying warm compresses as well as try to incise it once with noticing some drainage.  Due to continued discomfort, redness as well as pain, presented the emergency department.  Has had to have similar areas excised in the past.  States his Tdap is up-to-date.  Denies any fever, chills, night sweats.  Past medical history significant for diabetes mellitus, hyperlipidemia, OSA  Home Medications Prior to Admission medications   Medication Sig Start Date End Date Taking? Authorizing Provider  doxycycline (VIBRAMYCIN) 100 MG capsule Take 1 capsule (100 mg total) by mouth 2 (two) times daily. 08/18/23  Yes Sherian Maroon A, PA  aspirin EC 81 MG tablet Take 81 mg by mouth daily.     [provider]  empagliflozin (JARDIANCE) 25 MG TABS tablet Take 25 mg by mouth daily.    [provider]  ezetimibe (ZETIA) 10 MG tablet Take 10 mg by mouth daily. 04/29/20   [provider]  glimepiride (AMARYL) 2 MG tablet Take 2 mg by mouth daily with breakfast.     [provider]  LANTUS SOLOSTAR 100 UNIT/ML Solostar Pen 15 Units daily. 12/07/21   [provider]  magnesium oxide (MAG-OX) 400 MG tablet Take 400 mg by mouth daily.    [provider]  metFORMIN (GLUCOPHAGE-XR) 500 MG 24 hr tablet Take 2,000 mg by mouth daily with breakfast.  09/23/11   [provider]  MYRBETRIQ 50 MG TB24 tablet Take 50 mg by mouth daily. 12/07/21   [provider]  omeprazole (PRILOSEC) 20 MG capsule Take 1 capsule (20 mg total)  by mouth daily. 12/27/22   [name], MD  oxybutynin (DITROPAN-XL) 10 MG 24 hr tablet Take 10 mg by mouth daily. 12/07/21   [provider]  pioglitazone (ACTOS) 15 MG tablet Take 15 mg by mouth daily. 05/10/20   [provider]  VICTOZA 18 MG/3ML SOPN Inject 1.8 mg into the skin daily. 10/09/16   [provider]  Zinc 50 MG TABS Take by mouth daily at 6 (six) AM.    [provider]      Allergies    Patient has no known allergies.    Review of Systems   Review of Systems  All other systems reviewed and are negative.   Physical Exam Updated Vital Signs BP 117/72 (BP Location: Right Arm)   Pulse 96   Temp 98.5 F (36.9 C) (Oral)   Resp 18   SpO2 98%  Physical Exam Vitals and nursing note reviewed.  Constitutional:      General: He is not in acute distress.    Appearance: He is well-developed.  HENT:     Head: Normocephalic and atraumatic.  Eyes:     Conjunctiva/sclera: Conjunctivae normal.  Cardiovascular:     Rate and Rhythm: Normal rate and regular rhythm.  Pulmonary:     Effort: Pulmonary effort is normal. No respiratory distress.     Breath sounds: Normal breath sounds.  Abdominal:  Palpations: Abdomen is soft.     Tenderness: There is no abdominal tenderness.  Musculoskeletal:        General: No swelling.     Cervical back: Neck supple.  Skin:    Capillary Refill: Capillary refill takes less than 2 seconds.     Comments: Patient with erythematous area left posterior neck approximately 4 to 5 cm in diameter.  Area warm to touch.  Indurated area more centrally without any appreciable palpable fluctuance measuring roughly 2.0 centimeters in diameter.  Bedside ultrasound did show small 1.0x1.0 pocket with surrounding cobblestoning consistent with abscess.    Neurological:     Mental Status: He is alert.  Psychiatric:        Mood and Affect: Mood normal.     ED Results / Procedures / Treatments   Labs (all labs  ordered are listed, but only abnormal results are displayed) Labs Reviewed - No data to display  EKG None  Radiology No results found.  Procedures .Marland KitchenIncision and Drainage  Date/Time: 08/18/2023 11:07 AM  Performed by: Peter Garter, PA Authorized by: Peter Garter, PA   Consent:    Consent obtained:  Verbal   Consent given by:  Patient   Risks, benefits, and alternatives were discussed: yes     Risks discussed:  Bleeding, damage to other organs, infection, incomplete drainage and pain   Alternatives discussed:  No treatment, delayed treatment and alternative treatment Universal protocol:    Procedure explained and questions answered to patient or proxy's satisfaction: yes     Relevant documents present and verified: yes     Patient identity confirmed:  Verbally with patient Location:    Type:  Abscess   Size:  1.5   Location:  Neck   Neck location:  L posterior Pre-procedure details:    Skin preparation:  Povidone-iodine Sedation:    Sedation type:  None Anesthesia:    Anesthesia method:  Local infiltration   Local anesthetic:  Lidocaine 2% WITH epi Procedure type:    Complexity:  Simple Procedure details:    Ultrasound guidance: no     Needle aspiration: no     Incision types:  Single straight   Wound management:  Probed and deloculated and irrigated with saline   Drainage:  Purulent and bloody   Drainage amount:  Moderate   Wound treatment:  Wound left open   Packing materials:  None Post-procedure details:    Procedure completion:  Tolerated well, no immediate complications     Medications Ordered in ED Medications  lidocaine-EPINEPHrine (XYLOCAINE W/EPI) 2 %-1:200000 (PF) injection 10 mL (10 mLs Infiltration Given by Other 08/18/23 1037)    ED Course/ Medical Decision Making/ A&P                                 Medical Decision Making Risk Prescription drug management.   This patient presents to the ED for concern of abscess, this involves  an extensive number of treatment options, and is a complaint that carries with it a high risk of complications and morbidity.  The differential diagnosis includes abscess, cellulitis, sepsis, necrotizing infection, other   Co morbidities that complicate the patient evaluation  See HPI   Additional history obtained:  Additional history obtained from EMR External records from outside source obtained and reviewed including hospital records   Lab Tests:  N/a   Imaging Studies ordered:  N/a   Cardiac Monitoring: /  EKG:  The patient was maintained on a cardiac monitor.  I personally viewed and interpreted the cardiac monitored which showed an underlying rhythm of: sinus rhythm   Consultations Obtained:  N/a    Problem List / ED Course / Critical interventions / Medication management  abscess I ordered medication including lidocaine with epinephrine   Reevaluation of the patient after these medicines showed that the patient improved I have reviewed the patients home medicines and have made adjustments as needed   Social Determinants of Health:  Denies tobacco, or drug use   Test / Admission - Considered:  Abscess Vitals signs within normal range and stable throughout visit. 72 year old male presents emergency department complaints of abscess.  On exam, patient with area of induration, erythema on posterior left neck.  Bedside ultrasound to confirm presence of abscess.  Abscess drained in manner depicted above.  Given surrounding erythematous skin changes, will place patient on empiric antibiotics.  Recommend proper wound care at home as well as follow-up with primary care for reassessment.  Treatment plan discussed at length with patient and he acknowledged understanding was agreeable to said plan.  Patient overall well-appearing, afebrile in no acute distress. Worrisome signs and symptoms were discussed with the patient, and the patient acknowledged understanding to  return to the ED if noticed. Patient was stable upon discharge.          Final Clinical Impression(s) / ED Diagnoses Final diagnoses:  Abscess    Rx / DC Orders ED Discharge Orders          Ordered    doxycycline (VIBRAMYCIN) 100 MG capsule  2 times daily        08/18/23 1105              Peter Garter, [place] 08/18/23 1108    Gloris [place], MD 08/18/23 1556

## 2023-08-18 NOTE — Discharge Instructions (Addendum)
As discussed, abscess was drained on the emergency department.  Will place you on antibiotics given surrounding redness.  You may wash area gently with warm soapy water but avoid submersion in bodies of water.  Recommend follow-up with primary care for reassessment of your symptoms.  Please do not hesitate to return to emergency department for worrisome signs and symptoms we discussed become apparent.

## 2023-08-18 NOTE — ED Triage Notes (Signed)
Pt c/o abscess to back of neck x 1 week. Large, hard, red area of swelling noted to back of neck on left side. States he gets clogged up hair follicles sometimes. Has been treating at home with heat.

## 2023-08-18 NOTE — ED Notes (Signed)
Incision tray and lidocaine placed at bedside--PA-C made aware

## 2023-08-27 DIAGNOSIS — R350 Frequency of micturition: Secondary | ICD-10-CM | POA: Diagnosis not present

## 2023-08-27 DIAGNOSIS — N401 Enlarged prostate with lower urinary tract symptoms: Secondary | ICD-10-CM | POA: Diagnosis not present

## 2023-09-16 ENCOUNTER — Other Ambulatory Visit: Payer: Self-pay

## 2023-09-16 ENCOUNTER — Emergency Department (HOSPITAL_COMMUNITY)
Admission: EM | Admit: 2023-09-16 | Discharge: 2023-09-16 | Disposition: A | Discharge status: Home / Self Care | Payer: PPO

## 2023-09-16 ENCOUNTER — Encounter (HOSPITAL_COMMUNITY): Payer: Self-pay

## 2023-09-16 ENCOUNTER — Emergency Department (HOSPITAL_COMMUNITY): Payer: PPO

## 2023-09-16 DIAGNOSIS — Z23 Encounter for immunization: Secondary | ICD-10-CM | POA: Insufficient documentation

## 2023-09-16 DIAGNOSIS — E119 Type 2 diabetes mellitus without complications: Secondary | ICD-10-CM | POA: Diagnosis not present

## 2023-09-16 DIAGNOSIS — Z7984 Long term (current) use of oral hypoglycemic drugs: Secondary | ICD-10-CM | POA: Insufficient documentation

## 2023-09-16 DIAGNOSIS — Z794 Long term (current) use of insulin: Secondary | ICD-10-CM | POA: Insufficient documentation

## 2023-09-16 DIAGNOSIS — L0291 Cutaneous abscess, unspecified: Secondary | ICD-10-CM

## 2023-09-16 DIAGNOSIS — Z7982 Long term (current) use of aspirin: Secondary | ICD-10-CM | POA: Insufficient documentation

## 2023-09-16 DIAGNOSIS — L02512 Cutaneous abscess of left hand: Secondary | ICD-10-CM | POA: Insufficient documentation

## 2023-09-16 MED ORDER — TETANUS-DIPHTH-ACELL PERTUSSIS 5-2.5-18.5 LF-MCG/0.5 IM SUSY
0.5000 mL | PREFILLED_SYRINGE | Freq: Once | INTRAMUSCULAR | Status: AC
  Administered 2023-09-16: 0.5 mL via INTRAMUSCULAR
  Filled 2023-09-16: qty 0.5

## 2023-09-16 MED ORDER — DOXYCYCLINE HYCLATE 100 MG PO CAPS: 100.0000 mg | ORAL_CAPSULE | Freq: Two times a day (BID) | ORAL | 0 refills | Status: AC

## 2023-09-16 MED ORDER — DOUBLE ANTIBIOTIC 500-10000 UNIT/GM EX OINT
TOPICAL_OINTMENT | Freq: Once | CUTANEOUS | Status: DC
  Filled 2023-09-16: qty 1

## 2023-09-16 MED ORDER — DOXYCYCLINE HYCLATE 100 MG PO TABS
100.0000 mg | ORAL_TABLET | Freq: Once | ORAL | Status: AC
  Administered 2023-09-16: 100 mg via ORAL
  Filled 2023-09-16: qty 1

## 2023-09-16 MED ORDER — LIDOCAINE-EPINEPHRINE-TETRACAINE (LET) TOPICAL GEL
3.0000 mL | Freq: Once | TOPICAL | Status: AC
  Administered 2023-09-16: 3 mL via TOPICAL
  Filled 2023-09-16: qty 3

## 2023-09-16 NOTE — ED Triage Notes (Signed)
C/o abscess to left pinky finger x2 days with bloody drainage.  Heat, redness, and swelling noted.

## 2023-09-16 NOTE — ED Provider Notes (Signed)
[place] EMERGENCY DEPARTMENT AT [facility] Provider Note   CSN: [id number] Arrival date & time: 09/16/23  1824     History  Chief Complaint  Patient presents with   Abscess    Micheal [name]. is a 72 y.o. male past medical history significant for diabetes presents today for abscess to left pinky finger x 2 days.  Patient states he is unsure of any injury that occurred but he has had ingrown hairs and abscesses that needed draining previously.  Patient does not know when his last Tdap was.  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  Patient denies fever, chills, nausea, vomiting, or diarrhea.  Patient endorses redness, swelling, pain to the left little finger near the MCP joint and extending halfway up the dorsal palm.   Abscess      Home Medications Prior to Admission medications   Medication Sig Start Date End Date Taking? Authorizing Provider  doxycycline (VIBRAMYCIN) 100 MG capsule Take 1 capsule (100 mg total) by mouth 2 (two) times daily for 7 days. 09/16/23 09/23/23 Yes Dolphus Jenny, PA-C  aspirin EC 81 MG tablet Take 81 mg by mouth daily.     [provider]  empagliflozin (JARDIANCE) 25 MG TABS tablet Take 25 mg by mouth daily.    [provider]  ezetimibe (ZETIA) 10 MG tablet Take 10 mg by mouth daily. 04/29/20   [provider]  glimepiride (AMARYL) 2 MG tablet Take 2 mg by mouth daily with breakfast.     [provider]  LANTUS SOLOSTAR 100 UNIT/ML Solostar Pen 15 Units daily. 12/07/21   [provider]  magnesium oxide (MAG-OX) 400 MG tablet Take 400 mg by mouth daily.    [provider]  metFORMIN (GLUCOPHAGE-XR) 500 MG 24 hr tablet Take 2,000 mg by mouth daily with breakfast.  09/23/11   [provider]  MYRBETRIQ 50 MG TB24 tablet Take 50 mg by mouth daily. 12/07/21   [provider]  omeprazole (PRILOSEC) 20 MG capsule Take 1 capsule (20 mg  total) by mouth daily. 12/27/22   [name], MD  oxybutynin (DITROPAN-XL) 10 MG 24 hr tablet Take 10 mg by mouth daily. 12/07/21   [provider]  pioglitazone (ACTOS) 15 MG tablet Take 15 mg by mouth daily. 05/10/20   [provider]  VICTOZA 18 MG/3ML SOPN Inject 1.8 mg into the skin daily. 10/09/16   [provider]  Zinc 50 MG TABS Take by mouth daily at 6 (six) AM.    [provider]      Allergies    Patient has no known allergies.    Review of Systems   Review of Systems  Musculoskeletal:  Positive for arthralgias.    Physical Exam Updated Vital Signs BP 139/86 (BP Location: Right Arm)   Pulse 80   Temp 97.8 F (36.6 C) (Oral)   Resp 16   Ht 6\' 2"  (1.88 m)   Wt 120.2 kg   SpO2 97%   BMI 34.02 kg/m  Physical Exam Vitals and nursing note reviewed.  Constitutional:      General: He is not in acute distress.    Appearance: He is well-developed.  HENT:     Head: Normocephalic and atraumatic.  Eyes:     Conjunctiva/sclera: Conjunctivae normal.  Cardiovascular:     Rate and Rhythm: Normal rate and regular rhythm.     Heart  sounds: No murmur heard. Pulmonary:     Effort: Pulmonary effort is normal. No respiratory distress.     Breath sounds: Normal breath sounds.  Abdominal:     Palpations: Abdomen is soft.  Musculoskeletal:        General: No swelling.     Cervical back: Neck supple.  Skin:    General: Skin is warm and dry.     Capillary Refill: Capillary refill takes less than 2 seconds.     Comments: Approximately 1 cm abscess to the dorsal middle finger near the MCP joint with purulent drainage.  Tenderness to palpation.  Range of motion limited due to swelling.  Erythema extending approximately halfway of the dorsal side of the finger.  Area warm to touch.  Neurological:     Mental Status: He is alert.  Psychiatric:        Mood and Affect: Mood normal.     ED Results / Procedures / Treatments   Labs (all  labs ordered are listed, but only abnormal results are displayed) Labs Reviewed - No data to display  EKG None  Radiology No results found.  Procedures .Marland KitchenIncision and Drainage  Date/Time: 09/16/2023 10:38 PM  Performed by: Dolphus Jenny, PA-C Authorized by: Dolphus Jenny, PA-C   Consent:    Consent obtained:  Verbal   Consent given by:  Patient   Risks discussed:  Pain and infection   Alternatives discussed:  No treatment Location:    Type:  Abscess   Location:  Upper extremity   Upper extremity location:  Finger   Finger location:  L small finger Pre-procedure details:    Skin preparation:  Povidone-iodine Anesthesia:    Anesthesia method:  Topical application Procedure type:    Complexity:  Simple Procedure details:    Incision types:  Stab incision   Wound management:  Irrigated with saline and extensive cleaning   Drainage:  Bloody and purulent   Drainage amount:  Moderate   Wound treatment:  Wound left open   Packing materials:  None Post-procedure details:    Procedure completion:  Tolerated     Medications Ordered in ED Medications  doxycycline (VIBRA-TABS) tablet 100 mg (has no administration in time range)  lidocaine-EPINEPHrine-tetracaine (LET) topical gel (3 mLs Topical Given 09/16/23 2040)  Tdap (BOOSTRIX) injection 0.5 mL (0.5 mLs Intramuscular Given 09/16/23 2038)    ED Course/ Medical Decision Making/ A&P                                 Medical Decision Making Amount and/or Complexity of Data Reviewed Radiology: ordered.  Risk Prescription drug management.   This patient presents to the ED with chief complaint(s) of abscess to left pinky finger with pertinent past medical history of abscesses and diabetes which further complicates the presenting complaint. The complaint involves an extensive differential diagnosis and also carries with it a high risk of complications and morbidity.    The differential diagnosis includes abscess,  cellulitis, osteomyelitis  Additional history obtained: Records reviewed Primary Care Documents  ED Course and Reassessment: Patient given first dose of doxycycline  Independent visualization of imaging: - I independently visualized the following imaging with scope of interpretation limited to determining acute life threatening conditions related to emergency care: Left hand x-ray, which revealed no obvious signs of osteomyelitis.  Consultation: - Consulted or discussed management/test interpretation w/ external professional: None  Consideration for admission or further workup:  Patient has no systemic symptoms and has remained stable throughout entire ER stay.  Patient should follow-up with PCP within the next week to evaluate abscess healing.        Final Clinical Impression(s) / ED Diagnoses Final diagnoses:  Abscess    Rx / DC Orders ED Discharge Orders          Ordered    doxycycline (VIBRAMYCIN) 100 MG capsule  2 times daily        09/16/23 2239              Dolphus Jenny, PA-C 09/16/23 2245    Durwin Glaze, MD 09/17/23 [phone]

## 2023-09-16 NOTE — Discharge Instructions (Addendum)
Today you were treated for an abscess of your left little finger.  Please pick up your antibiotic and take as prescribed.  May also take Tylenol or Motrin for pain as needed.  Please follow-up with your primary care physician within the next week.  Please keep the wound clean and follow the attached instructions. Thank you for letting us treat you today. After reviewing your  imaging, I feel you are safe to go home. Please follow up with your PCP in the next several days and provide them with your records from this visit. Return to the Emergency Room if pain becomes severe or symptoms worsen.

## 2023-12-30 ENCOUNTER — Encounter (INDEPENDENT_AMBULATORY_CARE_PROVIDER_SITE_OTHER): Payer: Self-pay | Admitting: Gastroenterology

## 2023-12-30 ENCOUNTER — Ambulatory Visit (INDEPENDENT_AMBULATORY_CARE_PROVIDER_SITE_OTHER): Payer: Medicare PPO | Admitting: Gastroenterology

## 2023-12-30 VITALS — BP 120/82 | HR 93 | Temp 98.2°F | Ht 74.0 in | Wt 285.0 lb

## 2023-12-30 DIAGNOSIS — R7989 Other specified abnormal findings of blood chemistry: Secondary | ICD-10-CM | POA: Diagnosis not present

## 2023-12-30 DIAGNOSIS — K219 Gastro-esophageal reflux disease without esophagitis: Secondary | ICD-10-CM

## 2023-12-30 DIAGNOSIS — K7581 Nonalcoholic steatohepatitis (NASH): Secondary | ICD-10-CM

## 2023-12-30 DIAGNOSIS — K2100 Gastro-esophageal reflux disease with esophagitis, without bleeding: Secondary | ICD-10-CM

## 2023-12-30 NOTE — Patient Instructions (Addendum)
 Perform blood workup Continue omeprazole  20 mg every day Work on weight loss, can implement a Mediterranean diet

## 2023-12-30 NOTE — Progress Notes (Signed)
 Micheal [name], M.D. Gastroenterology & Hepatology [facility] [address] [place], [place] [address]  Primary Care Physician: Artemisa Bile, MD [address] [place] [address]  I will communicate my assessment and recommendations to the referring MD via EMR.  Problems: Grade B esophagitis GERD Elevated LFTs, likely secondary to NASH   History of Present Illness: Micheal [name]. is a 73 y.o. male with past medical history of diabetes, hyperlipidemia, NASH, hypertension, OSA and GERD, who presents for follow-up of GERD and NASH.  The patient was last seen on 12/27/2022. At that time, the patient was continued on omeprazole  20 mg daily.  CBC and CMP were checked on 01/08/2023, CBC was within normal limits, CMP showed mild elevation of alkaline phosphatase 139, AST 35 and ALT 30, albumin was 4.7 and total bilirubin was 0.3.  Patient denies having any complaints.  States that as long he takes omeprazole  20 mg every day, he does not have dysphagia, odynophagia or dysphagia.  The patient denies having any nausea, vomiting, fever, chills, hematochezia, melena, hematemesis, abdominal distention, abdominal pain, diarrhea, jaundice, pruritus or weight loss.  Last EGD: 06/28/2020.  EGD showed presence of grade B esophagitis.  Biopsies from the mid and distal esophagus were taken which were positive for changes consistent with reflux but negative for eosinophils.  There was also a scalloped mucosa in his duodenum but this was negative for celiac disease.     Last Colonoscopy: 04/11/2022 5 polyps between 3 to 6 mm were removed from the rectum, ascending colon and transverse colon, there was presence of diverticulosis in the sigmoid and descending colon, internal hemorrhoids. All polyps were tubular adenomas. Recommended to have a repeat colonoscopy in 3 years  Past Medical History: Past Medical History:  Diagnosis Date   Diabetes mellitus     NIDDM   History of kidney stones    Hyperlipidemia    Kidney stones    Seasonal allergies    Sleep apnea sleep study 12/08/2007   no CPAP use; uses a mouth guard at night   Umbilical hernia     Past Surgical History: Past Surgical History:  Procedure Laterality Date   BIOPSY  06/28/2020   Procedure: BIOPSY;  Surgeon: Urban Garden, MD;  [facility];  Service: Gastroenterology;;   COLONOSCOPY  10/28/2006; 10/23/2011   COLONOSCOPY  10/23/2011   SLF:  1). Internal hemorroids, small  2). POLYPOID LESION (AC) . next TCS 10/2016   COLONOSCOPY N/A 03/20/2017   Procedure: COLONOSCOPY;  Surgeon: Ruby Corporal, MD;  [facility]: [facility];  Service: Endoscopy;  Laterality: N/A;  1030-rescheduled 03/20/17 per Lorelee Roger - pt to arrive at 10:00   COLONOSCOPY WITH PROPOFOL  N/A 04/11/2022   Procedure: COLONOSCOPY WITH PROPOFOL ;  Surgeon: Urban Garden, MD;  [facility]: [facility];  Service: Gastroenterology;  Laterality: N/A;  10:00   CYSTOSCOPY W/ URETERAL STENT PLACEMENT  04/12/2011   right retrograde; basketing of stone   ESOPHAGOGASTRODUODENOSCOPY N/A 10/12/2014   RMR:  abnormal esophagus as described above query EOE status post biopsy; food impaction passes spontaneously with intubation of the esophagus c/w EOE, probable noncritical stricture at GEJ with possible ring component. food bolus had passed.    ESOPHAGOGASTRODUODENOSCOPY N/A 11/16/2014   Procedure: ESOPHAGOGASTRODUODENOSCOPY (EGD);  Surgeon: Suzette Espy, MD;  [facility];  Service: Endoscopy;  Laterality: N/A;  0830   ESOPHAGOGASTRODUODENOSCOPY (EGD) WITH PROPOFOL  N/A 06/28/2020   Procedure: ESOPHAGOGASTRODUODENOSCOPY (EGD) WITH PROPOFOL ;  Surgeon: Umberto Ganong, Bearl Limes, MD;  [facility]: [facility];  Service: Gastroenterology;  Laterality: N/A;  1130   HERNIA REPAIR     MALONEY DILATION N/A 11/16/2014   Procedure: Londa Rival DILATION;  Surgeon: Suzette Espy, MD;  [facility];   Service: Endoscopy;  Laterality: N/A;   POLYPECTOMY  04/11/2022   Procedure: POLYPECTOMY INTESTINAL;  Surgeon: Urban Garden, MD;  [facility];  Service: Gastroenterology;;   Dixie Frederickson DILATION N/A 11/16/2014   Procedure: Dixie Frederickson DILATION;  Surgeon: Suzette Espy, MD;  [facility]: [facility];  Service: Endoscopy;  Laterality: N/A;   UMBILICAL HERNIA REPAIR  10/26/2011   Procedure: HERNIA REPAIR UMBILICAL ADULT;  Surgeon: Enid Harry, MD;  Location: [facility];  Service: General;  Laterality: N/A;  umbilical hernia repair with mesh    Family History: Family History  Problem Relation Age of Onset   Cancer Mother        colon, age 52 deceased   Stroke Father    Diabetes Brother     Social History: Social History   Tobacco Use  Smoking Status Never  Smokeless Tobacco Never   Social History   Substance and Sexual Activity  Alcohol Use Yes   Alcohol/week: 1.0 - 3.0 standard drink of alcohol   Types: 1 - 3 Cans of beer per week   Comment: occasionally   Social History   Substance and Sexual Activity  Drug Use No    Allergies: No Known Allergies  Medications: Current Outpatient Medications  Medication Sig Dispense Refill   aspirin EC 81 MG tablet Take 81 mg by mouth daily.      empagliflozin (JARDIANCE) 25 MG TABS tablet Take 25 mg by mouth daily.     ezetimibe (ZETIA) 10 MG tablet Take 10 mg by mouth daily.     glimepiride (AMARYL) 2 MG tablet Take 2 mg by mouth daily with breakfast.      LANTUS SOLOSTAR 100 UNIT/ML Solostar Pen 35 Units daily.     magnesium oxide (MAG-OX) 400 MG tablet Take 400 mg by mouth daily.     metFORMIN (GLUCOPHAGE-XR) 500 MG 24 hr tablet Take 2,000 mg by mouth daily with breakfast.      omeprazole  (PRILOSEC) 20 MG capsule Take 1 capsule (20 mg total) by mouth daily. 90 capsule 3   oxybutynin (DITROPAN-XL) 10 MG 24 hr tablet Take 10 mg by mouth daily.     pioglitazone (ACTOS) 15 MG tablet Take 15 mg by  mouth daily.     VICTOZA 18 MG/3ML SOPN Inject 1.8 mg into the skin daily.     Zinc 50 MG TABS Take by mouth daily at 6 (six) AM.     No current facility-administered medications for this visit.    Review of Systems: GENERAL: negative for malaise, night sweats HEENT: No changes in hearing or vision, no nose bleeds or other nasal problems. NECK: Negative for lumps, goiter, pain and significant neck swelling RESPIRATORY: Negative for cough, wheezing CARDIOVASCULAR: Negative for chest pain, leg swelling, palpitations, orthopnea GI: SEE HPI MUSCULOSKELETAL: Negative for joint pain or swelling, back pain, and muscle pain. SKIN: Negative for lesions, rash PSYCH: Negative for sleep disturbance, mood disorder and recent psychosocial stressors. HEMATOLOGY Negative for prolonged bleeding, bruising easily, and swollen nodes. ENDOCRINE: Negative for cold or heat intolerance, polyuria, polydipsia and goiter. NEURO: negative for tremor, gait imbalance, syncope and seizures. The remainder of the review of systems is noncontributory.   Physical Exam: BP  120/82   Pulse 93   Temp 98.2 F (36.8 C) (Oral)   Ht 6\' 2"  (1.88 m)   Wt 285 lb (129.3 kg)   BMI 36.59 kg/m  GENERAL: The patient is AO x3, in no acute distress. Obse.  HEENT: Head is normocephalic and atraumatic. EOMI are intact. Mouth is well hydrated and without lesions. NECK: Supple. No masses LUNGS: Clear to auscultation. No presence of rhonchi/wheezing/rales. Adequate chest expansion HEART: RRR, normal s1 and s2. ABDOMEN: Soft, nontender, no guarding, no peritoneal signs, and nondistended. BS +. No masses. EXTREMITIES: Without any cyanosis, clubbing, rash, lesions or edema. NEUROLOGIC: AOx3, no focal motor deficit. SKIN: no jaundice, no rashes  Imaging/Labs: as above  I personally reviewed and interpreted the available labs, imaging and endoscopic files.  Impression and Plan: Micheal [name]. is a 73 y.o. male with past  medical history of diabetes, hyperlipidemia, NASH, hypertension, OSA and GERD, who presents for follow-up of GERD and NASH.  Patient has presented adequate control of his reflux symptoms while taking omeprazole  on a daily basis.  Denies having any complaints.  Will continue with low-dose PPI daily.  Regarding his elevated LFTs, these appear to be related to NASH.  Will recheck surveillance labs today.  Patient was advised to implement Mediterranean diet and working on weight loss.  Depending on results of blood workup, may need to consider pursuing further serologies.  -Check CBC and CMP -Continue omeprazole  20 mg every day -Work on weight loss, implement a Mediterranean diet  All questions were answered.      Micheal Cress, MD Gastroenterology and Hepatology [facility]

## 2024-01-16 ENCOUNTER — Other Ambulatory Visit (INDEPENDENT_AMBULATORY_CARE_PROVIDER_SITE_OTHER): Payer: Self-pay | Admitting: Gastroenterology

## 2024-01-16 DIAGNOSIS — K2100 Gastro-esophageal reflux disease with esophagitis, without bleeding: Secondary | ICD-10-CM

## 2024-03-18 DIAGNOSIS — Z794 Long term (current) use of insulin: Secondary | ICD-10-CM | POA: Diagnosis not present

## 2024-03-18 DIAGNOSIS — Z7985 Long-term (current) use of injectable non-insulin antidiabetic drugs: Secondary | ICD-10-CM | POA: Diagnosis not present

## 2024-03-18 DIAGNOSIS — N3941 Urge incontinence: Secondary | ICD-10-CM | POA: Diagnosis not present

## 2024-03-18 DIAGNOSIS — E1169 Type 2 diabetes mellitus with other specified complication: Secondary | ICD-10-CM | POA: Diagnosis not present

## 2024-03-18 DIAGNOSIS — N401 Enlarged prostate with lower urinary tract symptoms: Secondary | ICD-10-CM | POA: Diagnosis not present

## 2024-03-18 DIAGNOSIS — R350 Frequency of micturition: Secondary | ICD-10-CM | POA: Diagnosis not present

## 2024-03-18 DIAGNOSIS — R81 Glycosuria: Secondary | ICD-10-CM | POA: Diagnosis not present

## 2024-03-18 DIAGNOSIS — Z7984 Long term (current) use of oral hypoglycemic drugs: Secondary | ICD-10-CM | POA: Diagnosis not present

## 2024-04-07 DIAGNOSIS — E119 Type 2 diabetes mellitus without complications: Secondary | ICD-10-CM | POA: Diagnosis not present

## 2024-04-14 DIAGNOSIS — E785 Hyperlipidemia, unspecified: Secondary | ICD-10-CM | POA: Diagnosis not present

## 2024-04-14 DIAGNOSIS — R809 Proteinuria, unspecified: Secondary | ICD-10-CM | POA: Diagnosis not present

## 2024-04-14 DIAGNOSIS — Z79899 Other long term (current) drug therapy: Secondary | ICD-10-CM | POA: Diagnosis not present

## 2024-04-14 DIAGNOSIS — E1129 Type 2 diabetes mellitus with other diabetic kidney complication: Secondary | ICD-10-CM | POA: Diagnosis not present

## 2024-04-14 DIAGNOSIS — I10 Essential (primary) hypertension: Secondary | ICD-10-CM | POA: Diagnosis not present

## 2024-04-21 DIAGNOSIS — I10 Essential (primary) hypertension: Secondary | ICD-10-CM | POA: Diagnosis not present

## 2024-04-21 DIAGNOSIS — R809 Proteinuria, unspecified: Secondary | ICD-10-CM | POA: Diagnosis not present

## 2024-04-21 DIAGNOSIS — E785 Hyperlipidemia, unspecified: Secondary | ICD-10-CM | POA: Diagnosis not present

## 2024-04-21 DIAGNOSIS — E1129 Type 2 diabetes mellitus with other diabetic kidney complication: Secondary | ICD-10-CM | POA: Diagnosis not present

## 2024-04-21 DIAGNOSIS — M7501 Adhesive capsulitis of right shoulder: Secondary | ICD-10-CM | POA: Diagnosis not present

## 2024-04-21 DIAGNOSIS — Z0001 Encounter for general adult medical examination with abnormal findings: Secondary | ICD-10-CM | POA: Diagnosis not present

## 2024-04-21 DIAGNOSIS — R000 Tachycardia, unspecified: Secondary | ICD-10-CM | POA: Diagnosis not present

## 2024-05-05 ENCOUNTER — Telehealth: Payer: Self-pay

## 2024-05-05 NOTE — Telephone Encounter (Signed)
 Up to date on meds, follow up with me on 05/12/24 to discuss treatment options for DM

## 2024-06-17 DIAGNOSIS — M25511 Pain in right shoulder: Secondary | ICD-10-CM | POA: Diagnosis not present

## 2024-07-17 ENCOUNTER — Other Ambulatory Visit (INDEPENDENT_AMBULATORY_CARE_PROVIDER_SITE_OTHER): Payer: Self-pay | Admitting: Gastroenterology

## 2024-07-17 DIAGNOSIS — L0291 Cutaneous abscess, unspecified: Secondary | ICD-10-CM | POA: Diagnosis not present

## 2024-07-17 DIAGNOSIS — K2100 Gastro-esophageal reflux disease with esophagitis, without bleeding: Secondary | ICD-10-CM

## 2024-07-19 DIAGNOSIS — L0291 Cutaneous abscess, unspecified: Secondary | ICD-10-CM | POA: Diagnosis not present

## 2024-09-02 ENCOUNTER — Encounter (INDEPENDENT_AMBULATORY_CARE_PROVIDER_SITE_OTHER): Payer: Self-pay | Admitting: Gastroenterology
